# Patient Record
Sex: Male | Born: 1960 | Race: White | Hispanic: No | Marital: Married | Smoking: Never smoker
Health system: Southern US, Community
[De-identification: ages and names within clinical notes are randomized; demographics above are authoritative.]

## PROBLEM LIST (undated history)

## (undated) DIAGNOSIS — K831 Obstruction of bile duct: Secondary | ICD-10-CM

## (undated) DIAGNOSIS — D129 Benign neoplasm of anus and anal canal: Secondary | ICD-10-CM

## (undated) DIAGNOSIS — M109 Gout, unspecified: Secondary | ICD-10-CM

## (undated) DIAGNOSIS — I10 Essential (primary) hypertension: Secondary | ICD-10-CM

## (undated) HISTORY — DX: Gout, unspecified: M10.9

## (undated) HISTORY — DX: Benign neoplasm of anus and anal canal: D12.9

## (undated) HISTORY — PX: BACK SURGERY: SHX140

## (undated) HISTORY — DX: Essential (primary) hypertension: I10

## (undated) SURGERY — ERCP, WITH INTERVENTION IF INDICATED
Anesthesia: Moderate Sedation

---

## 1991-04-05 HISTORY — PX: CHOLECYSTECTOMY: SHX55

## 1999-07-10 ENCOUNTER — Emergency Department (HOSPITAL_COMMUNITY): Admission: EM | Admit: 1999-07-10 | Discharge: 1999-07-10 | Payer: Self-pay | Admitting: Emergency Medicine

## 1999-07-10 ENCOUNTER — Encounter: Payer: Self-pay | Admitting: Emergency Medicine

## 2007-01-29 ENCOUNTER — Ambulatory Visit (HOSPITAL_COMMUNITY): Admission: RE | Admit: 2007-01-29 | Discharge: 2007-01-29 | Payer: Self-pay | Admitting: Neurological Surgery

## 2009-03-18 ENCOUNTER — Encounter: Payer: Self-pay | Admitting: Cardiovascular Disease

## 2009-11-11 ENCOUNTER — Ambulatory Visit: Payer: Self-pay | Admitting: Radiology

## 2009-11-11 ENCOUNTER — Encounter: Payer: Self-pay | Admitting: Cardiovascular Disease

## 2009-11-11 ENCOUNTER — Emergency Department (HOSPITAL_BASED_OUTPATIENT_CLINIC_OR_DEPARTMENT_OTHER): Admission: EM | Admit: 2009-11-11 | Discharge: 2009-11-11 | Payer: Self-pay | Admitting: Emergency Medicine

## 2009-11-19 ENCOUNTER — Ambulatory Visit: Payer: Self-pay | Admitting: Cardiovascular Disease

## 2009-11-19 DIAGNOSIS — R079 Chest pain, unspecified: Secondary | ICD-10-CM

## 2009-12-16 LAB — HM COLONOSCOPY

## 2010-01-01 ENCOUNTER — Telehealth: Payer: Self-pay | Admitting: Cardiovascular Disease

## 2010-01-08 ENCOUNTER — Ambulatory Visit: Payer: Self-pay | Admitting: Cardiovascular Disease

## 2010-05-04 NOTE — Assessment & Plan Note (Signed)
Summary: nph/chest pains   Visit Type:  Initial Consult Primary Provider:  Casimiro Needle Hilt,M.D.  CC:  c/o having chest pain and shoulder pain and increased BP.Marland Kitchen  History of Present Illness: 50 year old male with a history of back surgery 2 years ago, who reports having symptoms of malaise over the past week, hypertension, right-sided chest pain. He presented for evaluation.  He reports that one week ago, he was driving in his car and he felt hot and nauseous. He pulled over and his symptoms resolved. The next day, he did not feel right. He had decreased concentration, GI upset. He went to urgent care and they found his blood pressure to be 170/105 at decreased moderately on recheck. He was given an aspirin. He reported that his shoulders were sore, he had right-sided chest pressure. They sent him to the emergency room.  In the emergency room, EKG, cardiac enzymes were essentially normal. Chest x-ray was normal. His CK was mildly elevated at 464. Other cardiac markers were normal.he was discharged home. Since then he has felt somewhat fatigued but today he feels better.  EKG today shows normal sinus rhythm with rate 71 beats per minute, no significant ST or T wave changes.  EKG from August 10 shows normal sinus rhythm with rate 64 beats per minute, no significant ST or T wave changes.  Preventive Screening-Counseling & Management  Alcohol-Tobacco     Smoking Status: quit  Caffeine-Diet-Exercise     Does Patient Exercise: no      Drug Use:  no.    Current Medications (verified): 1)  None  Allergies (verified): No Known Drug Allergies  Past History:  Family History: Last updated: 11/19/2009 Family History of Hypertension: Mother & Father  Social History: Last updated: 11/19/2009 Tobacco Use - Former. occas. cigars only. Alcohol Use - yes Regular Exercise - no Drug Use - no Full Time--Operations Manager  Married   Risk Factors: Exercise: no (11/19/2009)  Risk  Factors: Smoking Status: quit (11/19/2009)  Past Medical History: Hypertension  Past Surgical History: gallbladder removed  Family History: Family History of Hypertension: Mother & Father  Social History: Tobacco Use - Former. occas. cigars only. Alcohol Use - yes Regular Exercise - no Drug Use - no Full Time--Operations Manager  Married  Smoking Status:  quit Does Patient Exercise:  no Drug Use:  no  Review of Systems  The patient denies fever, weight loss, weight gain, vision loss, decreased hearing, hoarseness, chest pain, syncope, dyspnea on exertion, peripheral edema, prolonged cough, abdominal pain, incontinence, muscle weakness, depression, and enlarged lymph nodes.         right side chest pain, now resolved. malaise, lethargy  Vital Signs:  Patient profile:   50 year old male Height:      74 inches Weight:      249 pounds BMI:     32.09 Pulse rate:   72 / minute BP sitting:   146 / 97  (left arm)  Vitals Entered By: Bishop Dublin, CMA (November 19, 2009 2:37 PM)  Physical Exam  General:  Well developed, well nourished, in no acute distress. Head:  normocephalic and atraumatic Neck:  Neck supple, no JVD. No masses, thyromegaly or abnormal cervical nodes. Lungs:  Clear bilaterally to auscultation and percussion. Heart:  Non-displaced PMI, chest non-tender; regular rate and rhythm, S1, S2 without murmurs, rubs or gallops. Carotid upstroke normal, no bruit. Normal abdominal aortic size, no bruits. Pedals normal pulses. No edema, no varicosities. Abdomen:  Bowel sounds positive; abdomen soft  and non-tender without masses, organomegaly, or hernias noted.  Msk:  Back normal, normal gait. Muscle strength and tone normal. Pulses:  pulses normal in all 4 extremities Extremities:  No clubbing or cyanosis. Neurologic:  Alert and oriented x 3. Skin:  Intact without lesions or rashes. Psych:  Normal affect.   Impression & Recommendations:  Problem # 1:  CHEST PAIN  UNSPECIFIED (ICD-786.50) etiology of his right-sided chest pain one week ago is likely noncardiac. He has numerous other symptoms concerning for a viral syndrome. Today he feels that it is resolving. I do not think that he needs additional workup though if he does develop additional episodes of chest pain, I suggested he could have a treadmill done at [facility] and that he could call me to set this up at any time.  He has not had any other additional risk factors concerning for premature coronary artery disease. He does not have a family history, he is a nonsmoker, not a diabetic. EKGs are normal.  Orders: EKG w/ Interpretation (93000)  Appended Document: nph/chest pains he has mentioned that his blood pressure has been borderline elevated. I have asked that he watch his blood pressure at home. He'll likely have to purchase a blood pressure cuff. If his blood pressure continues to run with systolics greater than 140, diastolic greater than 90, asked him to contact me. His parents take lisinopril and this is one medication we could try.

## 2010-05-04 NOTE — Progress Notes (Signed)
Summary: STRESS TEST  Phone Note Call from Patient Call back at [phone]   Caller: SELF Call For: [facility] Summary of Call: WOULD LIKE TO TALK TO Mariah Milling ABOUT GETTING A STRESS TEST (AS WAS DISCUSSED AT A PREVIOUS VISIT)-WOULD LIKE TO KNOW WHAT IT ENTAILS. Initial call taken by: Harlon Flor,  January 01, 2010 10:25 AM  Follow-up for Phone Call        ETT scheduled for 10/6 @ 8am pt to arrive at 7:30am. Pt aware of appt.  Follow-up by: Benedict Needy, RN,  January 01, 2010 11:10 AM     Appended Document: STRESS TEST APPT rescheduled 10/7 [facility]

## 2010-05-04 NOTE — Letter (Signed)
Summary: PHI  PHI   Imported By: Harlon Flor 11/23/2009 09:45:38  _____________________________________________________________________  External Attachment:    Type:   Image     Comment:   External Document

## 2010-06-18 LAB — CBC
HCT: 46.3 % (ref 39.0–52.0)
Hemoglobin: 16 g/dL (ref 13.0–17.0)
MCH: 31 pg (ref 26.0–34.0)
MCV: 90.1 fL (ref 78.0–100.0)
Platelets: 228 10*3/uL (ref 150–400)
RBC: 5.14 MIL/uL (ref 4.22–5.81)

## 2010-06-18 LAB — DIFFERENTIAL
Eosinophils Absolute: 0.1 10*3/uL (ref 0.0–0.7)
Eosinophils Relative: 1 % (ref 0–5)
Lymphocytes Relative: 21 % (ref 12–46)
Lymphs Abs: 1.7 10*3/uL (ref 0.7–4.0)
Monocytes Absolute: 0.4 10*3/uL (ref 0.1–1.0)
Monocytes Relative: 5 % (ref 3–12)

## 2010-06-18 LAB — BASIC METABOLIC PANEL
CO2: 27 mEq/L (ref 19–32)
Chloride: 107 mEq/L (ref 96–112)
Creatinine, Ser: 1 mg/dL (ref 0.4–1.5)
GFR calc Af Amer: >60 mL/min (ref >60)
Potassium: 4.1 mEq/L (ref 3.5–5.1)

## 2010-06-18 LAB — POCT CARDIAC MARKERS
CKMB, poc: 7.7 ng/mL (ref 1.0–8.0)
Myoglobin, poc: 136 ng/mL (ref 12–200)
Troponin i, poc: <0.05 ng/mL (ref 0.00–0.09)
Troponin i, poc: <0.05 ng/mL (ref 0.00–0.09)

## 2010-06-18 LAB — CK: Total CK: 464 U/L — ABNORMAL HIGH (ref 7–232)

## 2010-08-17 NOTE — Op Note (Signed)
NAME:  Jon Phillips, Jon Phillips          ACCOUNT NO.:  [id number]   MEDICAL RECORD NO.:  [id number]          PATIENT TYPE:  OIB   LOCATION:  3040                         FACILITY:  [facility]   PHYSICIAN:  Stefani Dama, M.D.  DATE OF BIRTH:  1961/02/12   DATE OF PROCEDURE:  01/29/2007  DATE OF DISCHARGE:                               OPERATIVE REPORT   PREOPERATIVE DIAGNOSIS:  Herniated nucleus pulposus, L5-S1, right with  right lumbar radiculopathy.   POSTOPERATIVE DIAGNOSIS:  Herniated nucleus pulposus, L5-S1, right with  right lumbar radiculopathy.   PROCEDURE:  Right L5-S1 laminotomy, microdiskectomy with operating  microscope, microdissection technique.   SURGEON:  Stefani Dama, M.D.   ANESTHESIA:  General endotracheal.   INDICATIONS:  Jon Phillips is a 50 year old individual who has had  significant back and right lower extremity pain.  He has evidence of a  right lumbar radiculopathy.  The patient was taken to the operating room  where he underwent lumbar microdiskectomy at this time.   PROCEDURE:  The patient was brought to the operating room supine on the  stretcher.  After a smooth induction of general endotracheal anesthesia,  he was turned prone.  The back was prepped with alcohol and DuraPrep and  then draped in a sterile fashion.  The hair was trimmed with clippers  prior to prepping.  A midline incision was created in the lumbar spine  and taken down to the lumbodorsal fascia after needle localization of  the L5-S1 space.  The paraspinous musculature was opened on the right  side over the L5-S1 space and a subperiosteal dissection was performed  to expose the interlaminar space at L5 and S1.  Then, the yellow  ligament was taken up with a 2 and 3-mm Kerrison punch.  The common  dural tube was exposed.  The L5 nerve root was noted to be tented  dorsally and laterally against the mass.  The medial wall of the facet  was taken down to facilitate exposure of the nerve and  also to help  mobilize the nerve carefully off the mass, which was found to be a large  free fragment of disk.  A singular large fragment of disk was removed  and this created an opening into the disk space itself.  Further  exploration yielded two other smaller fragments.  This allowed for good  decompression of the underside of the nerve root.  The disk fragments  themselves were somewhat adherent to both the dura and the underlying  ligament.  The ligamentous opening was then enlarged and the disk space  opening was enlarged, and this exposed a considerable amount of  considerably degenerated disk material.  This was resected in a  piecemeal fashion using a combination of curettes and rongeurs, nerve  hooks and probes.  This was also done with the use of the operating  microscope.  Microdissection technique was performed prior to the  diskectomy so as to allow mobilization of the nerve root more fully and  exposure in the laminotomy site.  In the end, the disk space was  decompressed of a substantial quantity of disk material.  The nerve root  itself was well decompressed in its travel out the foramen.  Hemostasis  was achieved in all quadrants.  The wound was irrigated copiously with  antibiotic irrigating solution, and then the microscope was removed and  the lumbodorsal fascia was then closed with #1 Vicryl in an interrupted  fashion, 2-0 Vicryl was used in the subcutaneous tissues, 3-0 Vicryl was  used to close the subcuticular tissues.  A total of 30 mL of 0.5%  Marcaine along with 5 mL of 1% local lidocaine with epinephrine was  injected into both subcutaneous and deep tissues.   The patient tolerated the procedure well.  Blood loss estimated about 50  mL.  He was returned to the recovery room in stable condition.      Stefani Dama, M.D.  Electronically Signed     HJE/MEDQ  D:  01/29/2007  T:  01/29/2007  Job:  [id number]

## 2010-10-13 ENCOUNTER — Encounter: Payer: Self-pay | Admitting: Cardiovascular Disease

## 2011-01-12 LAB — BASIC METABOLIC PANEL
BUN: 7
Creatinine, Ser: 0.98
GFR calc non Af Amer: >60
Glucose, Bld: 94

## 2011-01-12 LAB — CBC
HCT: 43.5
Platelets: 210
RDW: 12.1
WBC: 7.2

## 2011-04-05 DIAGNOSIS — K831 Obstruction of bile duct: Secondary | ICD-10-CM

## 2011-04-05 HISTORY — DX: Obstruction of bile duct: K83.1

## 2011-04-29 ENCOUNTER — Ambulatory Visit
Admission: RE | Admit: 2011-04-29 | Discharge: 2011-04-29 | Disposition: A | Discharge status: Home / Self Care | Payer: BC Managed Care – PPO | Source: Ambulatory Visit | Attending: Family Medicine | Admitting: Family Medicine

## 2011-04-29 ENCOUNTER — Other Ambulatory Visit: Payer: Self-pay | Admitting: Family Medicine

## 2011-04-29 DIAGNOSIS — R17 Unspecified jaundice: Secondary | ICD-10-CM

## 2011-05-02 ENCOUNTER — Other Ambulatory Visit: Payer: Self-pay | Admitting: Family Medicine

## 2011-05-02 DIAGNOSIS — K769 Liver disease, unspecified: Secondary | ICD-10-CM

## 2011-05-03 ENCOUNTER — Ambulatory Visit
Admission: RE | Admit: 2011-05-03 | Discharge: 2011-05-03 | Disposition: A | Discharge status: Home / Self Care | Payer: BC Managed Care – PPO | Source: Ambulatory Visit | Attending: Family Medicine | Admitting: Family Medicine

## 2011-05-03 DIAGNOSIS — K769 Liver disease, unspecified: Secondary | ICD-10-CM

## 2011-05-03 MED ORDER — GADOBENATE DIMEGLUMINE 529 MG/ML IV SOLN
20.0000 mL | Freq: Once | INTRAVENOUS | Status: AC | PRN
  Administered 2011-05-03: 20 mL via INTRAVENOUS

## 2011-05-04 ENCOUNTER — Ambulatory Visit (HOSPITAL_COMMUNITY)
Admission: RE | Admit: 2011-05-04 | Discharge: 2011-05-04 | Disposition: A | Discharge status: Home / Self Care | Payer: BC Managed Care – PPO | Source: Ambulatory Visit | Attending: Gastroenterology | Admitting: Gastroenterology

## 2011-05-04 ENCOUNTER — Encounter (HOSPITAL_COMMUNITY): Payer: Self-pay | Admitting: *Deleted

## 2011-05-04 ENCOUNTER — Ambulatory Visit (HOSPITAL_COMMUNITY): Payer: BC Managed Care – PPO

## 2011-05-04 ENCOUNTER — Encounter (HOSPITAL_COMMUNITY)
Admission: RE | Disposition: A | Discharge status: Home / Self Care | Payer: Self-pay | Source: Ambulatory Visit | Attending: Gastroenterology

## 2011-05-04 ENCOUNTER — Other Ambulatory Visit: Payer: Self-pay | Admitting: Gastroenterology

## 2011-05-04 DIAGNOSIS — K838 Other specified diseases of biliary tract: Secondary | ICD-10-CM | POA: Insufficient documentation

## 2011-05-04 DIAGNOSIS — I10 Essential (primary) hypertension: Secondary | ICD-10-CM | POA: Insufficient documentation

## 2011-05-04 DIAGNOSIS — K831 Obstruction of bile duct: Secondary | ICD-10-CM | POA: Insufficient documentation

## 2011-05-04 DIAGNOSIS — R748 Abnormal levels of other serum enzymes: Secondary | ICD-10-CM | POA: Insufficient documentation

## 2011-05-04 DIAGNOSIS — R17 Unspecified jaundice: Secondary | ICD-10-CM | POA: Insufficient documentation

## 2011-05-04 HISTORY — PX: ERCP: SHX5425

## 2011-05-04 LAB — CBC
HCT: 43 % (ref 39.0–52.0)
MCHC: 35.3 g/dL (ref 30.0–36.0)
MCV: 87.8 fL (ref 78.0–100.0)
Platelets: 238 10*3/uL (ref 150–400)
RDW: 13.3 % (ref 11.5–15.5)
WBC: 7.7 10*3/uL (ref 4.0–10.5)

## 2011-05-04 LAB — COMPREHENSIVE METABOLIC PANEL
CO2: 23 mEq/L (ref 19–32)
Calcium: 9.7 mg/dL (ref 8.4–10.5)
Creatinine, Ser: 0.75 mg/dL (ref 0.50–1.35)
GFR calc Af Amer: >90 mL/min (ref >90)
GFR calc non Af Amer: >90 mL/min (ref >90)
Glucose, Bld: 93 mg/dL (ref 70–99)
Sodium: 135 mEq/L (ref 135–145)
Total Protein: 8 g/dL (ref 6.0–8.3)

## 2011-05-04 SURGERY — ERCP, WITH INTERVENTION IF INDICATED
Anesthesia: Moderate Sedation

## 2011-05-04 MED ORDER — BUTAMBEN-TETRACAINE-BENZOCAINE 2-2-14 % EX AERO
INHALATION_SPRAY | CUTANEOUS | Status: DC | PRN
  Administered 2011-05-04: 2 via TOPICAL

## 2011-05-04 MED ORDER — FENTANYL CITRATE 0.05 MG/ML IJ SOLN
INTRAMUSCULAR | Status: DC | PRN
  Administered 2011-05-04 (×4): 25 ug via INTRAVENOUS

## 2011-05-04 MED ORDER — SODIUM CHLORIDE 0.9 % IV SOLN
INTRAVENOUS | Status: DC | PRN
  Administered 2011-05-04: 10:00:00

## 2011-05-04 MED ORDER — MIDAZOLAM HCL 10 MG/2ML IJ SOLN
INTRAMUSCULAR | Status: DC | PRN
  Administered 2011-05-04 (×4): 2 mg via INTRAVENOUS

## 2011-05-04 MED ORDER — SODIUM CHLORIDE 0.9 % IV SOLN
Freq: Once | INTRAVENOUS | Status: AC
  Administered 2011-05-04: 500 mL via INTRAVENOUS

## 2011-05-04 MED ORDER — MIDAZOLAM HCL 10 MG/2ML IJ SOLN
INTRAMUSCULAR | Status: AC
  Filled 2011-05-04: qty 4

## 2011-05-04 MED ORDER — DIPHENHYDRAMINE HCL 50 MG/ML IJ SOLN
INTRAMUSCULAR | Status: AC
  Filled 2011-05-04: qty 1

## 2011-05-04 MED ORDER — FENTANYL CITRATE 0.05 MG/ML IJ SOLN
INTRAMUSCULAR | Status: AC
  Filled 2011-05-04: qty 4

## 2011-05-04 MED ORDER — GLUCAGON HCL (RDNA) 1 MG IJ SOLR
INTRAMUSCULAR | Status: AC
  Filled 2011-05-04: qty 1

## 2011-05-04 MED ORDER — DEXTROSE 5 % IV SOLN
1.0000 g | INTRAVENOUS | Status: AC
  Administered 2011-05-04: 1 g via INTRAVENOUS
  Filled 2011-05-04: qty 1

## 2011-05-04 NOTE — Op Note (Signed)
[facility] [address] [place], [place]  [address]  ERCP PROCEDURE REPORT  PATIENT:  Jon Phillips, Jon Phillips  MR#:  [id number] BIRTHDATE:  1960-12-30  GENDER:  male  ENDOSCOPIST:  Vida Rigger, MD ASSISTANT:  Rolanda Lundborg, Loleta Rose. Domingo Mend, RN  PROCEDURE DATE:  05/04/2011 PROCEDURE:  ERCP with balloon passage, ERCP with sphincterotomy, ERCP with stent placement, ERCP with brushing ASA CLASS:  Class I  INDICATIONS:  elevated liver tests abnormal MRCP  MEDICATIONS:  100 mcg fentanyl 8 mg Versed TOPICAL ANESTHETIC: Used  DESCRIPTION OF PROCEDURE:   After the risks benefits and alternatives of the procedure were thoroughly explained, informed consent was obtained.  The [id number] ([id number]) endoscope was introduced through the mouth and advanced to the second portion of the duodenum.  The examination was normal with no endoscopic findings except a small bulge just above the ampulla. Using the triple-lumen sphincterotome loaded with the JAG Jagwire deep selective cannulation was obtained on the third or fourth attempt however the wire was advanced once towards the pancreas but no dye was injected into the pancreas. The CBD was filled with contrast but no stone was seen and there seemed to be a distal smooth stricture about 4 or 5 cm long so we initially went ahead and proceeded with a sphincterotomy in the customary fashion which we extended through the bullge which may have been a small cyst. The sphincterotomy was done until we had adequate biliary drainage and could  Get the fully bowed sphincterotome easily in and out of the duct . We then exchanged for sphincterotome for the 12-15 mm adjustable balloon and proceeded with 3 balloon pull-through but no obvious stone was seen and there was only minimal resistance just above the ampulla. On occlusion cholangiogram initially the cystic duct was patent but on delayed films there was possibly a stone  or sludge or debris in the cystic duct. We went ahead and exchanged the balloon for a brush and proceeded with brushing in the customary fashion. We then placed the occlusion balloon then injected below the balloon and again no stone was seen in the CBD and the stricture was confirmed. We elected to place a 10 French 7 cm stent in the customary fashion which was in the proper position under fluoroscopy and endoscopic guidance we elected to stop the procedure at this juncture. The scope was then completely withdrawn from the patient and the procedure terminated. <<PROCEDUREIMAGES>>  COMPLICATIONS:  None ENDOSCOPIC IMPRESSION: 1 small bulge above the ampulla questionably small cyst 2. No stones seen in CBD questionably one in cystic duct on delayed films 3. 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 1 minimal pancreatic wire advancement but no pancreatic duct injections RECOMMENDATIONS: Observe for a delayed complications await brushing follow liver tests back to normal and probable proceed with an EUS next and will discuss with my partner Dr. Dulce Sellar and followup with me in the office in a few weeks unless needed sooner when necessary  ______________________________ Vida Rigger, MD  CC:  Lavada Mesi, MD  n. Rosalie DoctorVida Rigger at 05/04/2011 10:15 AM  Jodean Lima, [phone]

## 2011-05-05 ENCOUNTER — Encounter (HOSPITAL_COMMUNITY): Payer: Self-pay | Admitting: Gastroenterology

## 2011-05-05 LAB — TYPE AND SCREEN: ABO/RH(D): O POS

## 2011-06-27 ENCOUNTER — Other Ambulatory Visit (HOSPITAL_COMMUNITY): Payer: BC Managed Care – PPO

## 2011-06-28 ENCOUNTER — Ambulatory Visit (HOSPITAL_COMMUNITY)
Admission: RE | Admit: 2011-06-28 | Discharge: 2011-06-28 | Disposition: A | Discharge status: Home / Self Care | Payer: BC Managed Care – PPO | Source: Ambulatory Visit | Attending: Gastroenterology | Admitting: Gastroenterology

## 2011-06-28 ENCOUNTER — Ambulatory Visit (HOSPITAL_COMMUNITY): Payer: BC Managed Care – PPO

## 2011-06-28 ENCOUNTER — Encounter (HOSPITAL_COMMUNITY): Payer: Self-pay | Admitting: Anesthesiology

## 2011-06-28 ENCOUNTER — Ambulatory Visit (HOSPITAL_COMMUNITY): Payer: BC Managed Care – PPO | Admitting: Anesthesiology

## 2011-06-28 ENCOUNTER — Encounter (HOSPITAL_COMMUNITY)
Admission: RE | Disposition: A | Discharge status: Home / Self Care | Payer: Self-pay | Source: Ambulatory Visit | Attending: Gastroenterology

## 2011-06-28 ENCOUNTER — Encounter (HOSPITAL_COMMUNITY): Payer: Self-pay | Admitting: *Deleted

## 2011-06-28 DIAGNOSIS — Z9089 Acquired absence of other organs: Secondary | ICD-10-CM | POA: Insufficient documentation

## 2011-06-28 DIAGNOSIS — I10 Essential (primary) hypertension: Secondary | ICD-10-CM | POA: Insufficient documentation

## 2011-06-28 DIAGNOSIS — K8021 Calculus of gallbladder without cholecystitis with obstruction: Secondary | ICD-10-CM | POA: Insufficient documentation

## 2011-06-28 DIAGNOSIS — K831 Obstruction of bile duct: Secondary | ICD-10-CM | POA: Insufficient documentation

## 2011-06-28 HISTORY — PX: EUS: SHX5427

## 2011-06-28 HISTORY — PX: ERCP: SHX5425

## 2011-06-28 LAB — CBC
HCT: 40.1 % (ref 39.0–52.0)
Hemoglobin: 14.4 g/dL (ref 13.0–17.0)
MCH: 30.8 pg (ref 26.0–34.0)
MCV: 85.9 fL (ref 78.0–100.0)
RBC: 4.67 MIL/uL (ref 4.22–5.81)
WBC: 6.9 10*3/uL (ref 4.0–10.5)

## 2011-06-28 LAB — COMPREHENSIVE METABOLIC PANEL
CO2: 26 mEq/L (ref 19–32)
Calcium: 9 mg/dL (ref 8.4–10.5)
Chloride: 104 mEq/L (ref 96–112)
Creatinine, Ser: 0.96 mg/dL (ref 0.50–1.35)
GFR calc Af Amer: >90 mL/min (ref >90)
GFR calc non Af Amer: >90 mL/min (ref >90)
Glucose, Bld: 91 mg/dL (ref 70–99)
Total Bilirubin: 0.5 mg/dL (ref 0.3–1.2)

## 2011-06-28 LAB — TYPE AND SCREEN: Antibody Screen: NEGATIVE

## 2011-06-28 SURGERY — ERCP, WITH INTERVENTION IF INDICATED
Anesthesia: General

## 2011-06-28 MED ORDER — FENTANYL CITRATE 0.05 MG/ML IJ SOLN
INTRAMUSCULAR | Status: DC | PRN
  Administered 2011-06-28: 150 ug via INTRAVENOUS
  Administered 2011-06-28: 100 ug via INTRAVENOUS

## 2011-06-28 MED ORDER — ONDANSETRON HCL 4 MG/2ML IJ SOLN
INTRAMUSCULAR | Status: DC | PRN
  Administered 2011-06-28: 4 mg via INTRAVENOUS

## 2011-06-28 MED ORDER — FENTANYL CITRATE 0.05 MG/ML IJ SOLN: 25.0000 ug | INTRAMUSCULAR | Status: DC | PRN

## 2011-06-28 MED ORDER — LACTATED RINGERS IV SOLN
INTRAVENOUS | Status: DC
  Administered 2011-06-28: 09:00:00 via INTRAVENOUS
  Administered 2011-06-28: 1000 mL via INTRAVENOUS

## 2011-06-28 MED ORDER — ROCURONIUM BROMIDE 100 MG/10ML IV SOLN
INTRAVENOUS | Status: DC | PRN
  Administered 2011-06-28: 2 mg via INTRAVENOUS

## 2011-06-28 MED ORDER — SUCCINYLCHOLINE CHLORIDE 20 MG/ML IJ SOLN
INTRAMUSCULAR | Status: DC | PRN
  Administered 2011-06-28: 100 mg via INTRAVENOUS

## 2011-06-28 MED ORDER — PROPOFOL 10 MG/ML IV BOLUS
INTRAVENOUS | Status: DC | PRN
  Administered 2011-06-28: 200 mg via INTRAVENOUS

## 2011-06-28 MED ORDER — CEFOTETAN DISODIUM 1 G IJ SOLR
1.0000 g | Freq: Two times a day (BID) | INTRAMUSCULAR | Status: DC
Start: 2011-06-28 — End: 2011-06-28
  Administered 2011-06-28: 1 g via INTRAVENOUS
  Filled 2011-06-28 (×3): qty 1

## 2011-06-28 MED ORDER — SODIUM CHLORIDE 0.9 % IV SOLN
INTRAVENOUS | Status: DC | PRN
  Administered 2011-06-28: 10:00:00

## 2011-06-28 MED ORDER — LIDOCAINE HCL (CARDIAC) 20 MG/ML IV SOLN
INTRAVENOUS | Status: DC | PRN
  Administered 2011-06-28: 75 mg via INTRAVENOUS

## 2011-06-28 MED ORDER — GLUCAGON HCL (RDNA) 1 MG IJ SOLR
INTRAMUSCULAR | Status: DC | PRN
  Administered 2011-06-28: 1 mg via INTRAVENOUS

## 2011-06-28 MED ORDER — EPHEDRINE SULFATE 50 MG/ML IJ SOLN
INTRAMUSCULAR | Status: DC | PRN
  Administered 2011-06-28 (×3): 5 mg via INTRAVENOUS

## 2011-06-28 MED ORDER — MIDAZOLAM HCL 5 MG/5ML IJ SOLN
INTRAMUSCULAR | Status: DC | PRN
  Administered 2011-06-28: 2 mg via INTRAVENOUS

## 2011-06-28 NOTE — Anesthesia Postprocedure Evaluation (Signed)
  Anesthesia Post-op Note  Patient: Jon Phillips  Procedure(s) Performed: Procedure(s) (LRB): LOWER ENDOSCOPIC ULTRASOUND (EUS) (N/A) ENDOSCOPIC RETROGRADE CHOLANGIOPANCREATOGRAPHY (ERCP) (N/A)  Patient Location: PACU  Anesthesia Type: General  Level of Consciousness: awake and alert   Airway and Oxygen Therapy: Patient Spontanous Breathing  Post-op Pain: mild  Post-op Assessment: Post-op Vital signs reviewed, Patient's Cardiovascular Status Stable, Respiratory Function Stable, Patent Airway and No signs of Nausea or vomiting  Post-op Vital Signs: stable  Complications: No apparent anesthesia complications

## 2011-06-28 NOTE — Transfer of Care (Signed)
Immediate Anesthesia Transfer of Care Note  Patient: Jon Phillips  Procedure(s) Performed: Procedure(s) (LRB): LOWER ENDOSCOPIC ULTRASOUND (EUS) (N/A) ENDOSCOPIC RETROGRADE CHOLANGIOPANCREATOGRAPHY (ERCP) (N/A)  Patient Location: Endoscopy Unit  Anesthesia Type: General  Level of Consciousness: awake, sedated and patient cooperative  Airway & Oxygen Therapy: Patient Spontanous Breathing and Patient connected to face mask oxygen  Post-op Assessment: Report given to PACU RN and Post -op Vital signs reviewed and stable  Post vital signs: Reviewed and stable  Complications: No apparent anesthesia complications

## 2011-06-28 NOTE — Anesthesia Preprocedure Evaluation (Addendum)
Anesthesia Evaluation  Patient identified by MRN, date of birth, ID band Patient awake    Reviewed: Allergy & Precautions, H&P , NPO status , Patient's Chart, lab work & pertinent test results  Airway Mallampati: II TM Distance: >3 FB Neck ROM: Full    Dental No notable dental hx.    Pulmonary  No smoking history. breath sounds clear to auscultation  Pulmonary exam normal       Cardiovascular hypertension, Pt. on medications Rhythm:Regular Rate:Normal     Neuro/Psych negative neurological ROS  negative psych ROS   GI/Hepatic negative GI ROS, Neg liver ROS,   Endo/Other  negative endocrine ROS  Renal/GU negative Renal ROS  negative genitourinary   Musculoskeletal negative musculoskeletal ROS (+)   Abdominal   Peds negative pediatric ROS (+)  Hematology negative hematology ROS (+)   Anesthesia Other Findings   Reproductive/Obstetrics negative OB ROS                          Anesthesia Physical Anesthesia Plan  ASA: II  Anesthesia Plan: General   Post-op Pain Management:    Induction: Intravenous  Airway Management Planned: Oral ETT  Additional Equipment:   Intra-op Plan:   Post-operative Plan: Extubation in OR  Informed Consent: I have reviewed the patients History and Physical, chart, labs and discussed the procedure including the risks, benefits and alternatives for the proposed anesthesia with the patient or authorized representative who has indicated his/her understanding and acceptance.   Dental advisory given  Plan Discussed with: CRNA  Anesthesia Plan Comments:         Anesthesia Quick Evaluation

## 2011-06-28 NOTE — Op Note (Signed)
[facility] [address] [place], [place]  [address]  ERCP PROCEDURE REPORT  PATIENT:  Jon, Phillips  MR#:  [id number] BIRTHDATE:  Aug 09, 1960  GENDER:  male  ENDOSCOPIST:  Vida Rigger, MD ASSISTANT:  Bary Leriche  PROCEDURE DATE:  06/28/2011 PROCEDURE:  ERCP with brushing, ERCP with removal of stones, ERCP with sphincterotomy ASA CLASS:  Class II  INDICATIONS:    cystic duct stone  MEDICATIONS:   See Anesthesia Report. TOPICAL ANESTHETIC:  Cetacaine Spray  DESCRIPTION OF PROCEDURE:   After the risks benefits and alternatives of the procedure were thoroughly explained, informed consent was obtained.  The PENTAX [id number] endoscope was introduced through the mouth after my partner Dr. Dulce Sellar was done the EUS and advanced to the second portion of the duodenum. A normal appearing ampulla was brought into view and a small sphincterotomy site was seen and easily cannulated throughout the procedure and no PD injections or wire advancements were done.. We initially cannulated with the sphincterotome loaded with JAG Jagwire and deep selective cannulation was obtained and we elected first to enlarge the sphincterotomy site in the customary fashion until we had adequate biliary drainage and could get the fully bowed sphincterotome easily in and out of the duct. We then went ahead and brushed the distal part of the duct in the customary fashion and once done reinserted the sphincterotome and selectively cannulated the cystic duct and the wire was coiled at the end of the cystic duct. The stone was not only confirmed on EUS but seen on initial cholangiogram as well we then exchanged the adjustable balloon 12-15 mm and advanced it above the stone and proceeded with a balloon pull-through and stone removal in the customary fashion and with mild pressure the stone was delivered we then proceeded with a few occlusion cholangiogram which were normal without obvious  stricture or residual stones and the balloon passed readily through the patent sphincterotomy site although drainage was slightly sluggish. We elected to stop the procedure at this juncture and since no stricture was seen we elected to leave the stent out  The limited endoscopic examination was normal with no endoscopic findings.  The scope was then completely withdrawn from the patient and the procedure terminated. <<PROCEDUREIMAGES>>  COMPLICATIONS:  None ENDOSCOPIC IMPRESSION: 1 cystic duct stone status post removal with a balloon after increasing sphincterotomy and brushing distal CBD 2. No pancreatic duct injections or wire advancements 3. Normal occlusion cholangiogram at the end of the procedure  RECOMMENDATIONS: Observe for delayed complications await pathology and cytology call us when necessary and otherwise followup in one month to recheck symptoms liver tests and make sure no further workup and plans are needed  ______________________________ Vida Rigger, MD  CC:  Lavada Mesi, MD  n. Rosalie DoctorVida Rigger at 06/28/2011 09:43 AM  Jodean Lima, [phone]

## 2011-06-28 NOTE — Progress Notes (Signed)
Patient has had some abdominal pain and inability to pass flatus. Passing some gas now feels much better  Color pink B/P  Ok  Report to  Dr Council Mechanic patient may be discharged,

## 2011-06-28 NOTE — H&P (Signed)
Patient interval history reviewed.  Patient examined again.  There has been no change from documented H/P dated 06/24/11 and 05/20/11 (scanned into chart from our office) except as documented above.  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  Plan:  Endoscopic ultrasound to rule out pancreaticobiliary lesion, choledocholithiasis, cystic duct stone.           Endoscopic retrograde cholangiopancreatography (likely by Dr. Ewing Schlein) to follow.  Risks (bleeding, infection, bowel perforation that could require surgery, sedation-related changes in cardiopulmonary systems), benefits (identification and possible treatment of source of symptoms, exclusion of certain causes of symptoms), and alternatives (watchful waiting, radiographic imaging studies, empiric medical treatment) of upper endoscopy with ultrasound (EUS) were explained to patient in detail and he wishes to proceed.  Risks (up to and including bleeding, infection, perforation, pancreatitis that can be complicated by infected necrosis and death), benefits (removal of stones, alleviating blockage, decreasing risk of cholangitis or choledocholithiasis-related pancreatitis), and alternatives (watchful waiting, percutaneous transhepatic cholangiography) of ERCP were explained to patient in detail and he elects to proceed.

## 2011-06-28 NOTE — Discharge Instructions (Signed)
Endoscopic Retrograde Cholangiopancreatography (ERCP) °ERCP stands for endoscopic retrograde cholangiopancreatography. In this procedure, a thin, lighted tube (endoscope) is used. It is passed through the mouth and down the back of the throat into the upper part of the intestine, called the duodenum. A small, plastic tube (cannula) is then passed through the endoscope. It is directed into the bile duct or pancreatic duct. Dye is then injected through the tube. X-rays are taken to study the biliary and pancreatic passageways. This procedure is used to diagnosis many diseases of the pancreas, bile ducts, liver, and gallbladder. °LET YOUR CAREGIVER KNOW ABOUT:  °· Allergies to food or medicine.  °· Medicines taken, including vitamins, herbs, eyedrops, over-the-counter medicines, and creams.  °· Use of steroids (by mouth or creams).  °· Previous problems with anesthetics or numbing medicines.  °· History of bleeding problems or blood clots.  °· Previous surgery.  °· Other health problems, including diabetes and kidney problems.  °· Possibility of pregnancy, if this applies.  °· Any barium X-rays during the past week.  °BEFORE THE PROCEDURE  °· Do not eat or drink anything, including water, for at least 6 hours before the procedure.  °· Ask your caregiver whether you should stop taking certain medicines prior to your procedure.  °· Arrange for someone to drive you home. You will not be allowed to drive for several hours after the procedure.  °· Arrive at least 60 minutes before the procedure or as directed. This will give you time to check in and fill out any necessary paperwork.  °PROCEDURE  °· You will be given medicine through a vein (intravenously) to make you relaxed and sleepy.  °· You might have a breathing tube placed to give you medicine that makes you sleep (general anesthetic).  °· Your throat may be sprayed with medicine that numbs the area (local anesthetic).  °· You will lie on your left side. The endoscope  will be inserted through your mouth and into the duodenum. The tube will not interfere with your breathing. Gagging is prevented by the anesthesia.  °· While X-rays are being taken, you may be positioned on your stomach.  °During the ERCP, the person performing the procedure may identify a blockage or narrowed opening to the bile duct. A muscular portion of the main bile duct may be partially cut (sphincterotomy). A thin, plastic tube (stent) will be positioned inside your bile duct. This is to allow fluid secreted by the liver (bile) to flow more easily through the narrowed opening. °AFTER THE PROCEDURE  °· You will rest in bed until you are fully conscious.  °· When you first wake up, your throat may feel slightly sore.  °· You will not be allowed to eat or drink until numbness subsides.  °· Once you are able to drink, urinate, and sit on the edge of the bed without feeling sick to your stomach (nauseous) or dizzy, you may be allowed to go home.  °· Have a friend or family member with you for the first 24 hours after your procedure.  °SEEK MEDICAL CARE IF:  °· You have an oral temperature above 102° F (38.9° C).  °· You develop other signs of infection, including chills or feeling unwell.  °· You have abdominal pain.  °· You have questions or concerns.  °MAKE SURE YOU:  °· Understand these instructions.  °· Will watch your condition.  °· Will get help right away if you are not doing well or get worse.  °  Document Released: 12/14/2000 Document Revised: 12/01/2010 Document Reviewed: 07/07/2009 °Exit[employer]® Patient Information ©2012 [employer]. ° °

## 2011-06-28 NOTE — Op Note (Signed)
[facility] [address] [place], [place]  [address]  ENDOSCOPIC ULTRASOUND PROCEDURE REPORT  PATIENT:  Jon Phillips, Jon Phillips  MR#:  [id number] BIRTHDATE:  [birth year]  GENDER:  male  ENDOSCOPIST:  Willis Modena, MD REFERRED BY:  Lavada Mesi, M.D. Vida Rigger, MD PROCEDURE DATE:  06/28/2011 PROCEDURE:  Upper EUS ASA CLASS:  Class II INDICATIONS:  obstructive jaundice, CBD stricture, possible cystic duct stone MEDICATIONS:   Cetacaine spray x 2, general anesthesia  DESCRIPTION OF PROCEDURE:   After the risks benefits and alternatives of the procedure were  explained, informed consent was obtained. The patient was then placed in the left, lateral, decubitus postion and IV sedation was administered. Throughout the procedure, the patient's blood pressure, pulse and oxygen saturations were monitored continuously.  Under direct visualization, the  endoscope was introduced through the mouth and advanced to the second portion of the duodenum.  Water was used as necessary to provide an acoustic interface.  Upon completion of the imaging, water was removed and the patient was sent to the recovery room in satisfactory condition.  <<PROCEDUREIMAGES>>  FINDINGS:  Side-viewing endoscope was passed to the level of the ampulla; previously placed biliary stent was seen, grasped and subsequently removed with mini-snare (and sent in-toto for cytologic analysis).  Side-viewing duodenoscope was removed and then replaced with radial echoendoscope.  Pancreatic head and uncinate process were seen and appeared normal without evidence of chronic pancreatitis or mass.  The ampulla was seen; there were some soft hyperechoic areas within the ampulla and distal bile duct; this is likely debris from the stent, but a soft tissue lesion (e.g., cholangiocarcinoma) can't be definitively exclude. There was suggestion of persistent stricture in the distal-most 2-3 cm of the bile duct.  CBD not  dilated, the the cystic duct was dilated to   1cm.  There was a 7 x 13mm shadowing stone seen in the distal cystic duct stump.  Post-cholecystectomy anatomy seen.  ENDOSCOPIC IMPRESSION:    1.  Endoscopic removal of temporary biliary stent, sent for cytologic analysis. 2.  Prominent cystic duct with intraductal stone. 3.  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 4.  Hyperechoic tissue within distal bile duct, suspect debris from prior stent but can't definitively             rule out soft tissue lesion. 5.  No obvious pancreatic head or uncinate mass lesion or chronic pancreatitis.  RECOMMENDATIONS:      1.  Watch for potential complications of procedure. 2.  Await stent cytology. 3.  ERCP to follow by Dr. Ewing Schlein.  ______________________________ Willis Modena  CC:  n. eSIGNEDWillis Modena at 06/28/2011 09:14 AM  Jodean Lima, [phone]

## 2011-06-29 ENCOUNTER — Encounter (HOSPITAL_COMMUNITY): Payer: Self-pay | Admitting: Gastroenterology

## 2011-07-01 ENCOUNTER — Encounter (HOSPITAL_COMMUNITY): Payer: Self-pay | Admitting: Emergency Medicine

## 2011-07-01 ENCOUNTER — Inpatient Hospital Stay (HOSPITAL_COMMUNITY)
Admission: EM | Admit: 2011-07-01 | Discharge: 2011-07-05 | DRG: 207 | Disposition: A | Discharge status: Home / Self Care | Payer: BC Managed Care – PPO | Attending: Internal Medicine | Admitting: Internal Medicine

## 2011-07-01 DIAGNOSIS — B9689 Other specified bacterial agents as the cause of diseases classified elsewhere: Secondary | ICD-10-CM | POA: Diagnosis present

## 2011-07-01 DIAGNOSIS — R109 Unspecified abdominal pain: Secondary | ICD-10-CM | POA: Diagnosis present

## 2011-07-01 DIAGNOSIS — R7401 Elevation of levels of liver transaminase levels: Secondary | ICD-10-CM | POA: Diagnosis present

## 2011-07-01 DIAGNOSIS — I10 Essential (primary) hypertension: Secondary | ICD-10-CM | POA: Diagnosis present

## 2011-07-01 DIAGNOSIS — E860 Dehydration: Secondary | ICD-10-CM | POA: Diagnosis present

## 2011-07-01 DIAGNOSIS — R17 Unspecified jaundice: Secondary | ICD-10-CM | POA: Diagnosis present

## 2011-07-01 DIAGNOSIS — K8309 Other cholangitis: Principal | ICD-10-CM | POA: Diagnosis present

## 2011-07-01 DIAGNOSIS — R112 Nausea with vomiting, unspecified: Secondary | ICD-10-CM | POA: Diagnosis present

## 2011-07-01 DIAGNOSIS — D72829 Elevated white blood cell count, unspecified: Secondary | ICD-10-CM | POA: Diagnosis present

## 2011-07-01 DIAGNOSIS — E876 Hypokalemia: Secondary | ICD-10-CM | POA: Diagnosis present

## 2011-07-01 DIAGNOSIS — R509 Fever, unspecified: Secondary | ICD-10-CM | POA: Diagnosis present

## 2011-07-01 DIAGNOSIS — R7402 Elevation of levels of lactic acid dehydrogenase (LDH): Secondary | ICD-10-CM | POA: Diagnosis present

## 2011-07-01 NOTE — ED Notes (Signed)
Pt presented to the ER with c/o abd pain and discomfort, pt showing the epigastric region, pt further explains that he had on 26th endoscopic procedure, until Thursday pt reporting feeling "fine", however, there after pt states "i feel horrible". Had x2 vomiting episode at home.

## 2011-07-02 ENCOUNTER — Emergency Department (HOSPITAL_COMMUNITY): Payer: BC Managed Care – PPO

## 2011-07-02 ENCOUNTER — Encounter (HOSPITAL_COMMUNITY): Payer: Self-pay | Admitting: Family Medicine

## 2011-07-02 DIAGNOSIS — K8309 Other cholangitis: Secondary | ICD-10-CM | POA: Diagnosis present

## 2011-07-02 DIAGNOSIS — I10 Essential (primary) hypertension: Secondary | ICD-10-CM

## 2011-07-02 LAB — COMPREHENSIVE METABOLIC PANEL
Albumin: 4.3 g/dL (ref 3.5–5.2)
BUN: 11 mg/dL (ref 6–23)
Calcium: 9.7 mg/dL (ref 8.4–10.5)
GFR calc Af Amer: >90 mL/min (ref >90)
Glucose, Bld: 94 mg/dL (ref 70–99)
Potassium: 3.8 mEq/L (ref 3.5–5.1)
Total Protein: 7.7 g/dL (ref 6.0–8.3)

## 2011-07-02 LAB — DIFFERENTIAL
Basophils Relative: 0 % (ref 0–1)
Eosinophils Absolute: 0 10*3/uL (ref 0.0–0.7)
Eosinophils Relative: 0 % (ref 0–5)
Lymphs Abs: 0.5 10*3/uL — ABNORMAL LOW (ref 0.7–4.0)
Monocytes Relative: 6 % (ref 3–12)
Neutrophils Relative %: 90 % — ABNORMAL HIGH (ref 43–77)

## 2011-07-02 LAB — CBC
Hemoglobin: 15.1 g/dL (ref 13.0–17.0)
MCH: 30.4 pg (ref 26.0–34.0)
MCHC: 35.7 g/dL (ref 30.0–36.0)
MCV: 85.3 fL (ref 78.0–100.0)
Platelets: 199 10*3/uL (ref 150–400)
RBC: 4.96 MIL/uL (ref 4.22–5.81)

## 2011-07-02 LAB — URINALYSIS, ROUTINE W REFLEX MICROSCOPIC
Leukocytes, UA: NEGATIVE
Nitrite: NEGATIVE
Specific Gravity, Urine: >1.046 — ABNORMAL HIGH (ref 1.005–1.030)
pH: 7 (ref 5.0–8.0)

## 2011-07-02 LAB — LIPASE, BLOOD: Lipase: 20 U/L (ref 11–59)

## 2011-07-02 MED ORDER — POTASSIUM CHLORIDE IN NACL 20-0.9 MEQ/L-% IV SOLN
INTRAVENOUS | Status: DC
  Administered 2011-07-02: 13:00:00 via INTRAVENOUS
  Filled 2011-07-02 (×7): qty 1000

## 2011-07-02 MED ORDER — MORPHINE SULFATE 2 MG/ML IJ SOLN
2.0000 mg | INTRAMUSCULAR | Status: DC | PRN
  Administered 2011-07-02: 2 mg via INTRAVENOUS
  Filled 2011-07-02: qty 1

## 2011-07-02 MED ORDER — SODIUM CHLORIDE 0.9 % IV BOLUS (SEPSIS)
1000.0000 mL | Freq: Once | INTRAVENOUS | Status: AC
  Administered 2011-07-02: 1000 mL via INTRAVENOUS

## 2011-07-02 MED ORDER — PANTOPRAZOLE SODIUM 40 MG IV SOLR
40.0000 mg | INTRAVENOUS | Status: DC
  Administered 2011-07-02 – 2011-07-05 (×4): 40 mg via INTRAVENOUS
  Filled 2011-07-02 (×5): qty 40

## 2011-07-02 MED ORDER — ONDANSETRON HCL 4 MG/2ML IJ SOLN
4.0000 mg | Freq: Four times a day (QID) | INTRAMUSCULAR | Status: DC | PRN
  Administered 2011-07-02: 4 mg via INTRAVENOUS
  Filled 2011-07-02: qty 2

## 2011-07-02 MED ORDER — LISINOPRIL 10 MG PO TABS
10.0000 mg | ORAL_TABLET | Freq: Every day | ORAL | Status: DC
  Filled 2011-07-02: qty 1

## 2011-07-02 MED ORDER — OXYCODONE HCL 5 MG PO TABS: 5.0000 mg | ORAL_TABLET | ORAL | Status: DC | PRN

## 2011-07-02 MED ORDER — MORPHINE SULFATE 2 MG/ML IJ SOLN
2.0000 mg | INTRAMUSCULAR | Status: DC | PRN
  Administered 2011-07-02 – 2011-07-04 (×3): 2 mg via INTRAVENOUS
  Filled 2011-07-02 (×3): qty 1

## 2011-07-02 MED ORDER — ENOXAPARIN SODIUM 60 MG/0.6ML ~~LOC~~ SOLN
50.0000 mg | SUBCUTANEOUS | Status: DC
  Administered 2011-07-02 – 2011-07-05 (×4): 50 mg via SUBCUTANEOUS
  Filled 2011-07-02 (×4): qty 0.6

## 2011-07-02 MED ORDER — METRONIDAZOLE IN NACL 5-0.79 MG/ML-% IV SOLN
500.0000 mg | Freq: Three times a day (TID) | INTRAVENOUS | Status: DC
  Administered 2011-07-02 – 2011-07-05 (×10): 500 mg via INTRAVENOUS
  Filled 2011-07-02 (×13): qty 100

## 2011-07-02 MED ORDER — IOHEXOL 300 MG/ML  SOLN
100.0000 mL | Freq: Once | INTRAMUSCULAR | Status: AC | PRN
  Administered 2011-07-02: 100 mL via INTRAVENOUS

## 2011-07-02 MED ORDER — ACETAMINOPHEN 500 MG PO TABS: 500.0000 mg | ORAL_TABLET | ORAL | Status: DC | PRN

## 2011-07-02 MED ORDER — ENOXAPARIN SODIUM 40 MG/0.4ML ~~LOC~~ SOLN
40.0000 mg | SUBCUTANEOUS | Status: DC
  Filled 2011-07-02: qty 0.4

## 2011-07-02 MED ORDER — LEVOFLOXACIN IN D5W 750 MG/150ML IV SOLN
750.0000 mg | Freq: Once | INTRAVENOUS | Status: AC
  Administered 2011-07-02: 750 mg via INTRAVENOUS
  Filled 2011-07-02: qty 150

## 2011-07-02 MED ORDER — ONDANSETRON HCL 4 MG/2ML IJ SOLN
4.0000 mg | Freq: Once | INTRAMUSCULAR | Status: AC
  Administered 2011-07-02: 4 mg via INTRAVENOUS
  Filled 2011-07-02: qty 4

## 2011-07-02 MED ORDER — ONDANSETRON HCL 4 MG PO TABS: 4.0000 mg | ORAL_TABLET | Freq: Four times a day (QID) | ORAL | Status: DC | PRN

## 2011-07-02 MED ORDER — LEVOFLOXACIN IN D5W 750 MG/150ML IV SOLN
750.0000 mg | INTRAVENOUS | Status: DC
  Administered 2011-07-03 – 2011-07-05 (×3): 750 mg via INTRAVENOUS
  Filled 2011-07-02 (×4): qty 150

## 2011-07-02 MED ORDER — HYDROMORPHONE HCL PF 1 MG/ML IJ SOLN
1.0000 mg | Freq: Once | INTRAMUSCULAR | Status: AC
  Administered 2011-07-02: 1 mg via INTRAVENOUS
  Filled 2011-07-02: qty 1

## 2011-07-02 NOTE — ED Provider Notes (Signed)
51 year old male who recently had removal of a cystic duct stone that came in with fever, chills, and upper abdominal pain. CT does show a dilated common bile duct. WBC is elevated and his liver functions have all worsened compared with the last her functions. I am concerned at this point that he has cholangitis and he will be admitted for IV hydration. He may need to be considered for repeat ERCP to see if there was another stone that had not been visible on prior evaluations.  Dione Booze, MD 07/02/11 [phone]

## 2011-07-02 NOTE — Progress Notes (Signed)
Subjective: Currently w/o nausea, vomiting or significant belly discomfort. Patient has received antiemetics and pain meds before my visit. Patient with low grade fever.  Objective: Vital signs in last 24 hours: Temp:  [99 F (37.2 C)-101.5 F (38.6 C)] 99 F (37.2 C) (03/30 0650) Pulse Rate:  [79-104] 79  (03/30 0650) Resp:  [20-24] 20  (03/30 0650) BP: (92-146)/(38-67) 100/61 mmHg (03/30 0650) SpO2:  [94 %-98 %] 96 % (03/30 0650) Weight:  [108.9 kg (240 lb 1.3 oz)] 108.9 kg (240 lb 1.3 oz) (03/30 4540) Weight change:  Last BM Date: 07/01/11  Intake/Output from previous day:       Physical Exam: General: Alert, awake, oriented x3, in mild distress. HEENT: No bruits, no goiter. Heart: Regular rate and rhythm, without murmurs, rubs, gallops. Lungs: Clear to auscultation bilaterally. Abdomen: Soft, RUQ and epigastric tenderness (control now with pain meds), nondistended, positive bowel sounds. Extremities: No clubbing cyanosis or edema with positive pedal pulses. Neuro: Grossly intact, nonfocal.  Lab Results: Basic Metabolic Panel:  [facility] 07/02/11 0058  NA 139  K 3.8  CL 101  CO2 26  GLUCOSE 94  BUN 11  CREATININE 0.95  CALCIUM 9.7  MG --  PHOS --   Liver Function Tests:  [facility] 07/02/11 0058  AST 466*  ALT 324*  ALKPHOS 140*  BILITOT 4.0*  PROT 7.7  ALBUMIN 4.3    Basename 07/02/11 0058  LIPASE 20  AMYLASE --   CBC:  Basename 07/02/11 0058  WBC 14.3*  NEUTROABS 13.0*  HGB 15.1  HCT 42.3  MCV 85.3  PLT 199   Urinalysis:  Basename 07/02/11 0608  COLORURINE AMBER*  LABSPEC >1.046*  PHURINE 7.0  GLUCOSEU NEGATIVE  HGBUR NEGATIVE  BILIRUBINUR SMALL*  KETONESUR 15*  PROTEINUR NEGATIVE  UROBILINOGEN 1.0  NITRITE NEGATIVE  LEUKOCYTESUR NEGATIVE    Studies/Results: Ct Abdomen Pelvis W Contrast  07/02/2011  *RADIOLOGY REPORT*  Clinical Data: Severe epigastric abdominal pain, nausea and vomiting.  Endoscopy with stent removal on  06/28/2011.  CT ABDOMEN AND PELVIS WITH CONTRAST  Technique:  Multidetector CT imaging of the abdomen and pelvis was performed following the standard protocol during bolus administration of intravenous contrast.  Contrast: OMNIPAQUE IOHEXOL 300 MG/ML IJ SOLN  Comparison: Abdominal series 07/02/2011  Findings: Atelectasis or focal infiltration in the lung bases.  Surgical absence of the gallbladder.  Mild bile duct dilatation with intrahepatic and extrahepatic ductal dilatation.  Maximal diameter of the common hepatic duct is about 12 mm.  This is nonspecific and could represent the early obstruction or physiologic change in the postcholecystectomy state.  Surgical clip versus focal calcification in the medial segment of the left lobe of the liver.  Surgical clips versus suture material in the perihepatic fat.  Small accessory spleen.  The pancreas, spleen, adrenal glands, kidneys, abdominal aorta, and retroperitoneal lymph nodes are unremarkable.  The stomach, small bowel, and colon are not distended and no wall thickening is visualized.  No free air or free fluid in the abdomen.  Pelvis:  The bladder wall is not thickened.  Mild enlargement of the prostate gland and seminal vesicles.  There appear to be surgical clips in the pelvis.  No free or loculated pelvic fluid collections.  No inflammatory changes associated with the sigmoid colon.  No significant pelvic lymphadenopathy.  The appendix is not specifically identified but no inflammatory changes are shown in the right lower quadrant.  Degenerative changes at the lumbosacral interspace.  No destructive bone lesions appreciated.  IMPRESSION: Nonspecific bile duct dilatation which may be postoperative although early obstruction is not excluded.  No evidence of pancreatitis.  Scattered surgical clips or suture material demonstrated in the medial segment left lobe the liver and in the fat adjacent to the lateral aspect of the liver.  No free air.  No free  fluid.  Original Report Authenticated By: Marlon Pel, M.D.   Dg Abd Acute W/chest  07/02/2011  *RADIOLOGY REPORT*  Clinical Data: Recent ERCP.  Abdominal pain, nausea.  Question free air.  ACUTE ABDOMEN SERIES (ABDOMEN 2 VIEW & CHEST 1 VIEW)  Comparison: Chest x-ray 11/11/2009  Findings: Bibasilar opacities, left slightly greater than right, likely atelectasis.  Recommend clinical correlation to exclude infection in the left lung base.  Heart is borderline in size.  No visible effusions.  No free air.  Gas within nondistended large and small bowel.  Prior cholecystectomy.  Moderate stool burden in the colon.  No suspicious calcification or acute bony abnormality.  IMPRESSION: Bibasilar opacities, left greater than right, likely atelectasis. Recommend clinical correlation to exclude left basilar pneumonia.  Borderline heart size.  No evidence of bowel obstruction or free air.  Original Report Authenticated By: Cyndie Chime, M.D.    Medications: Scheduled Meds:   . enoxaparin (LOVENOX) injection  50 mg Subcutaneous Q24H  .  HYDROmorphone (DILAUDID) injection  1 mg Intravenous Once  . levofloxacin (LEVAQUIN) IV  750 mg Intravenous Once  . levofloxacin (LEVAQUIN) IV  750 mg Intravenous Q24H  . metronidazole  500 mg Intravenous Q8H  . ondansetron (ZOFRAN) IV  4 mg Intravenous Once  . pantoprazole (PROTONIX) IV  40 mg Intravenous Q24H  . sodium chloride  1,000 mL Intravenous Once  . DISCONTD: enoxaparin  40 mg Subcutaneous Q24H  . DISCONTD: lisinopril  10 mg Oral Daily   Continuous Infusions:   . 0.9 % NaCl with KCl 20 mEq / L     PRN Meds:.acetaminophen, iohexol, morphine, ondansetron (ZOFRAN) IV, ondansetron, oxyCODONE, DISCONTD: morphine  Assessment/Plan: 1-Acute cholangitis: most likely associated with recent ERCP and some debris material or stone passing through. Will provide supportive care, IV antibiotics using levaquin and flagyl and will follow GI recommendations. Per Dr.  Randa Evens, patient might required replacement of biliary stent. Patient NPO  2-Hypertension: stable and well controlled; since he is NPO; will hold off on lisinopril for now.  3-Nausea, transaminitis and hyperbilirubinemia: associated with #1. Will use PRN antiemetics; continue supportive care and follow patient symptoms evolution.     LOS: 1 day   Subrena Devereux [facility] [phone]  07/02/2011, 11:48 AM

## 2011-07-02 NOTE — ED Provider Notes (Signed)
Medical screening examination/treatment/procedure(s) were conducted as a shared visit with non-physician practitioner(s) and myself.  I personally evaluated the patient during the encounter   Dione Booze, MD 07/02/11 [phone]

## 2011-07-02 NOTE — ED Notes (Signed)
Patient transported to CT 

## 2011-07-02 NOTE — Progress Notes (Signed)
ANTIBIOTIC CONSULT NOTE - INITIAL  Pharmacy Consult for Levaquin Indication: R/O Pneumonia  No Known Allergies  Patient Measurements: Height: 6' 3.2" (191 cm) Weight: 240 lb 1.3 oz (108.9 kg) IBW/kg (Calculated) : 84.95    Vital Signs: Temp: 100.4 F (38 C) (03/30 0230) Temp src: Oral (03/30 0230) BP: 117/53 mmHg (03/30 0330) Pulse Rate: 89  (03/30 0330) Intake/Output from previous day:   Intake/Output from this shift:    Labs:  Basename 07/02/11 0058  WBC 14.3*  HGB 15.1  PLT 199  LABCREA --  CREATININE 0.95   Estimated Creatinine Clearance: 121.7 ml/min (by C-G formula based on Cr of 0.95). No results found for this basename: VANCOTROUGH:2,VANCOPEAK:2,VANCORANDOM:2,GENTTROUGH:2,GENTPEAK:2,GENTRANDOM:2,TOBRATROUGH:2,TOBRAPEAK:2,TOBRARND:2,AMIKACINPEAK:2,AMIKACINTROU:2,AMIKACIN:2, in the last 72 hours   Microbiology: No results found for this or any previous visit (from the past 720 hour(s)).  Medical History: Past Medical History  Diagnosis Date  . Hypertension     Medications:  Scheduled:    . enoxaparin  40 mg Subcutaneous Q24H  .  HYDROmorphone (DILAUDID) injection  1 mg Intravenous Once  . levofloxacin (LEVAQUIN) IV  750 mg Intravenous Once  . lisinopril  10 mg Oral Daily  . ondansetron (ZOFRAN) IV  4 mg Intravenous Once  . pantoprazole (PROTONIX) IV  40 mg Intravenous Q24H  . sodium chloride  1,000 mL Intravenous Once   Infusions:    . 0.9 % NaCl with KCl 20 mEq / L     Assessment:  51 year old male s/p emergent ERCP 06/28/11.  Presents with complaint of abdominal pain, nausea/vomiting  Chext xray reveals opacities likely atelectasis; reccomends r/o left basilar pneumonia  Goal of Therapy:  Eradication of infection  Plan:  Levaquin 750mg  IV q24h  Russell Engelstad Trefz 07/02/2011,6:08 AM

## 2011-07-02 NOTE — ED Provider Notes (Signed)
History     CSN: [id number]  Arrival date & time 07/01/11  2226   First MD Initiated Contact with Patient 07/02/11 0033      Chief Complaint  Patient presents with  . Post-op Problem     HPI  History provided by the patient and family. Patient is a 51 year old male with history of cholecystectomy and ERCP performed 4 days ago who presents with complaints of severe upper abdominal pain with nausea and vomiting. Symptoms began on Thursday and have gradually increased. Patient reports only 2 episodes of vomiting at home. he has also felt hot with fever. Pain is described as sharp and severe. Patient denies any aggravating or alleviating factors. He denies any other associated symptoms. Denies any constipation, diarrhea, chest pain, cough, shortness of breath.    Past Medical History  Diagnosis Date  . Hypertension     Past Surgical History  Procedure Date  . Cholecystectomy   . Back surgery   . Ercp 05/04/2011    Procedure: ENDOSCOPIC RETROGRADE CHOLANGIOPANCREATOGRAPHY (ERCP);  Surgeon: Petra Kuba, MD;  Location: [facility] ENDOSCOPY;  Service: Endoscopy;  Laterality: N/A;  fluoro  . Eus 06/28/2011    Procedure: LOWER ENDOSCOPIC ULTRASOUND (EUS);  Surgeon: Petra Kuba, MD;  Location: [facility] ENDOSCOPY;  Service: Endoscopy;  Laterality: N/A;  Nicole/Katy  Dr. Dulce Sellar to do EUS  . Ercp 06/28/2011    Procedure: ENDOSCOPIC RETROGRADE CHOLANGIOPANCREATOGRAPHY (ERCP);  Surgeon: Petra Kuba, MD;  Location: [facility] ENDOSCOPY;  Service: Endoscopy;  Laterality: N/A;  Dr. Ewing Schlein to do ERCP    Family History  Problem Relation Age of Onset  . Hypertension Mother   . Hypertension Father   . Malignant hyperthermia Neg Hx     History  Substance Use Topics  . Smoking status: Never Smoker   . Smokeless tobacco: Not on file   Comment: Occasional cigars only  . Alcohol Use: 1.2 oz/week    2 Cans of beer per week      Review of Systems  Constitutional: Positive for fever, chills and appetite change.    HENT: Negative for neck pain.   Respiratory: Negative for cough and shortness of breath.   Cardiovascular: Negative for chest pain.  Gastrointestinal: Positive for nausea, vomiting and abdominal pain. Negative for diarrhea and constipation.  Skin: Negative for rash.    Allergies  Review of patient's allergies indicates no known allergies.  Home Medications   Current Outpatient Rx  Name Route Sig Dispense Refill  . LISINOPRIL 20 MG PO TABS Oral Take 20 mg by mouth daily.      BP 146/66  Pulse 104  Temp(Src) 101.5 F (38.6 C) (Oral)  Resp 24  SpO2 95%  Physical Exam  Nursing note and vitals reviewed. Constitutional: He is oriented to person, place, and time. He appears well-developed and well-nourished. No distress.  HENT:  Head: Normocephalic and atraumatic.  Neck: Normal range of motion. Neck supple.       No crepitus  Cardiovascular: Regular rhythm.  Tachycardia present.   Pulmonary/Chest: Effort normal and breath sounds normal. No respiratory distress. He has no wheezes. He has no rales.  Abdominal: Soft. He exhibits no distension. There is tenderness in the right upper quadrant, epigastric area and left upper quadrant. There is no rigidity, no rebound, no guarding and no tenderness at McBurney's point.  Neurological: He is alert and oriented to person, place, and time.  Skin: Skin is warm.  Psychiatric: He has a normal mood and affect. His behavior  is normal.    ED Course  Procedures    Labs Reviewed  CBC  DIFFERENTIAL  COMPREHENSIVE METABOLIC PANEL  LIPASE, BLOOD   Ct Abdomen Pelvis W Contrast  07/02/2011  *RADIOLOGY REPORT*  Clinical Data: Severe epigastric abdominal pain, nausea and vomiting.  Endoscopy with stent removal on 06/28/2011.  CT ABDOMEN AND PELVIS WITH CONTRAST  Technique:  Multidetector CT imaging of the abdomen and pelvis was performed following the standard protocol during bolus administration of intravenous contrast.  Contrast: OMNIPAQUE  IOHEXOL 300 MG/ML IJ SOLN  Comparison: Abdominal series 07/02/2011  Findings: Atelectasis or focal infiltration in the lung bases.  Surgical absence of the gallbladder.  Mild bile duct dilatation with intrahepatic and extrahepatic ductal dilatation.  Maximal diameter of the common hepatic duct is about 12 mm.  This is nonspecific and could represent the early obstruction or physiologic change in the postcholecystectomy state.  Surgical clip versus focal calcification in the medial segment of the left lobe of the liver.  Surgical clips versus suture material in the perihepatic fat.  Small accessory spleen.  The pancreas, spleen, adrenal glands, kidneys, abdominal aorta, and retroperitoneal lymph nodes are unremarkable.  The stomach, small bowel, and colon are not distended and no wall thickening is visualized.  No free air or free fluid in the abdomen.  Pelvis:  The bladder wall is not thickened.  Mild enlargement of the prostate gland and seminal vesicles.  There appear to be surgical clips in the pelvis.  No free or loculated pelvic fluid collections.  No inflammatory changes associated with the sigmoid colon.  No significant pelvic lymphadenopathy.  The appendix is not specifically identified but no inflammatory changes are shown in the right lower quadrant.  Degenerative changes at the lumbosacral interspace.  No destructive bone lesions appreciated.  IMPRESSION: Nonspecific bile duct dilatation which may be postoperative although early obstruction is not excluded.  No evidence of pancreatitis.  Scattered surgical clips or suture material demonstrated in the medial segment left lobe the liver and in the fat adjacent to the lateral aspect of the liver.  No free air.  No free fluid.  Original Report Authenticated By: Marlon Pel, M.D.   Dg Abd Acute W/chest  07/02/2011  *RADIOLOGY REPORT*  Clinical Data: Recent ERCP.  Abdominal pain, nausea.  Question free air.  ACUTE ABDOMEN SERIES (ABDOMEN 2 VIEW &  CHEST 1 VIEW)  Comparison: Chest x-ray 11/11/2009  Findings: Bibasilar opacities, left slightly greater than right, likely atelectasis.  Recommend clinical correlation to exclude infection in the left lung base.  Heart is borderline in size.  No visible effusions.  No free air.  Gas within nondistended large and small bowel.  Prior cholecystectomy.  Moderate stool burden in the colon.  No suspicious calcification or acute bony abnormality.  IMPRESSION: Bibasilar opacities, left greater than right, likely atelectasis. Recommend clinical correlation to exclude left basilar pneumonia.  Borderline heart size.  No evidence of bowel obstruction or free air.  Original Report Authenticated By: Cyndie Chime, M.D.     1. Cholangitis       MDM  12:30 AM patient seen and evaluated. Patient no acute distress but appears very comfortable. She with recent ERCP and presents with upper abdominal pain, nausea vomiting and fever. Concerns for possible pancreatitis versus perforation. Will begin workup and treat symptoms.   Patient discussed with attending physician. Concerns for cholangitis. We'll plan to call hospitalist for admission.  Spoke with tried hospitalist. They will see patient and  admit.     Angus Seller, [place] 07/02/11 2115

## 2011-07-02 NOTE — Consult Note (Signed)
[facility] CONSULT Reason for consult: Chills and elevated liver test Referring Physician: [facility]: PCP: Dr. Lavada Mesi, GI: Dr. Ambrose Mantle Jon Phillips is an 51 y.o. male.  HPI: Nice 51 year old gentleman who had cholecystectomy a number of years ago and several weeks ago began to turn yellow. He had an emergent ERCP with sphincterotomy and stent placement. 4 days ago he had the stent removed in EUS was performed. This showed a prominent cystic duct intraductal stone possibly some debris in the bile. Patient then had ERCP with sphincterotomy and removal of stone material I. Dr. Ewing Schlein. The stone was seen to be removed. Brushings of the bile duct were negative for neoplasia. The occlusion cholangiogram revealed no evidence of filling defects, biliary leakage. The patient felt fine until yesterday. He actually went to his son's athletic event at a barbecue sandwich. Yesterday evening he began to have chills this without fever and some nausea. I talked with the family last night and told the wife he felt worse to come to the emergency room. He was admitted last night due to worsening symptoms. His labs revealed total bilirubin 4.0, elevated AST ALT and alkaline phosphatase. Both of these have been normal prior to these procedures earlier in the week. White blood count was elevated at 14.3. He had a temperature of 101 and now is 99. A CT scan in the ER this morning revealed no evidence of pancreatitis and a 12 mm common bile it was nonspecific. No material seen. There were no free air or abscess. The patient was given a single dose of Levaquin in the ER.  Past Medical History  Diagnosis Date  . Hypertension     Past Surgical History  Procedure Date  . Cholecystectomy   . Back surgery   . Ercp 05/04/2011    Procedure: ENDOSCOPIC RETROGRADE CHOLANGIOPANCREATOGRAPHY (ERCP);  Surgeon: Petra Kuba, MD;  Location: [facility] ENDOSCOPY;  Service: Endoscopy;  Laterality: N/A;  fluoro  . Eus  06/28/2011    Procedure: LOWER ENDOSCOPIC ULTRASOUND (EUS);  Surgeon: Petra Kuba, MD;  Location: [facility] ENDOSCOPY;  Service: Endoscopy;  Laterality: N/A;  Nicole/Katy  Dr. Dulce Sellar to do EUS  . Ercp 06/28/2011    Procedure: ENDOSCOPIC RETROGRADE CHOLANGIOPANCREATOGRAPHY (ERCP);  Surgeon: Petra Kuba, MD;  Location: [facility] ENDOSCOPY;  Service: Endoscopy;  Laterality: N/A;  Dr. Ewing Schlein to do ERCP    Family History  Problem Relation Age of Onset  . Hypertension Mother   . Hypertension Father   . Malignant hyperthermia Neg Hx     Social History:  reports that he has never smoked. He does not have any smokeless tobacco history on file. He reports that he drinks about 1.2 ounces of alcohol per week. He reports that he does not use illicit drugs.  Allergies: No Known Allergies  Medications;    . enoxaparin  40 mg Subcutaneous Q24H  .  HYDROmorphone (DILAUDID) injection  1 mg Intravenous Once  . levofloxacin (LEVAQUIN) IV  750 mg Intravenous Once  . lisinopril  10 mg Oral Daily  . ondansetron (ZOFRAN) IV  4 mg Intravenous Once  . pantoprazole (PROTONIX) IV  40 mg Intravenous Q24H  . sodium chloride  1,000 mL Intravenous Once   PRN Meds acetaminophen, iohexol, morphine, ondansetron (ZOFRAN) IV, ondansetron, oxyCODONE Results for orders placed during the hospital encounter of 07/01/11 (from the past 48 hour(s))  CBC     Status: Abnormal   Collection Time   07/02/11 12:58 AM  Component Value Range Comment   WBC 14.3 (*) 4.0 - 10.5 (K/uL)    RBC 4.96  4.22 - 5.81 (MIL/uL)    Hemoglobin 15.1  13.0 - 17.0 (g/dL)    HCT 16.1  09.6 - 04.5 (%)    MCV 85.3  78.0 - 100.0 (fL)    MCH 30.4  26.0 - 34.0 (pg)    MCHC 35.7  30.0 - 36.0 (g/dL)    RDW 40.9  81.1 - 91.4 (%)    Platelets 199  150 - 400 (K/uL)   DIFFERENTIAL     Status: Abnormal   Collection Time   07/02/11 12:58 AM      Component Value Range Comment   Neutrophils Relative 90 (*) 43 - 77 (%)    Neutro Abs 13.0 (*) 1.7 - 7.7 (K/uL)     Lymphocytes Relative 4 (*) 12 - 46 (%)    Lymphs Abs 0.5 (*) 0.7 - 4.0 (K/uL)    Monocytes Relative 6  3 - 12 (%)    Monocytes Absolute 0.8  0.1 - 1.0 (K/uL)    Eosinophils Relative 0  0 - 5 (%)    Eosinophils Absolute 0.0  0.0 - 0.7 (K/uL)    Basophils Relative 0  0 - 1 (%)    Basophils Absolute 0.0  0.0 - 0.1 (K/uL)   COMPREHENSIVE METABOLIC PANEL     Status: Abnormal   Collection Time   07/02/11 12:58 AM      Component Value Range Comment   Sodium 139  135 - 145 (mEq/L)    Potassium 3.8  3.5 - 5.1 (mEq/L)    Chloride 101  96 - 112 (mEq/L)    CO2 26  19 - 32 (mEq/L)    Glucose, Bld 94  70 - 99 (mg/dL)    BUN 11  6 - 23 (mg/dL)    Creatinine, Ser 7.82  0.50 - 1.35 (mg/dL)    Calcium 9.7  8.4 - 10.5 (mg/dL)    Total Protein 7.7  6.0 - 8.3 (g/dL)    Albumin 4.3  3.5 - 5.2 (g/dL)    AST 956 (*) 0 - 37 (U/L) NO VISIBLE HEMOLYSIS   ALT 324 (*) 0 - 53 (U/L)    Alkaline Phosphatase 140 (*) 39 - 117 (U/L)    Total Bilirubin 4.0 (*) 0.3 - 1.2 (mg/dL)    GFR calc non Af Amer >90  >90 (mL/min)    GFR calc Af Amer >90  >90 (mL/min)   LIPASE, BLOOD     Status: Normal   Collection Time   07/02/11 12:58 AM      Component Value Range Comment   Lipase 20  11 - 59 (U/L)   URINALYSIS, ROUTINE W REFLEX MICROSCOPIC     Status: Abnormal   Collection Time   07/02/11  6:08 AM      Component Value Range Comment   Color, Urine AMBER (*) YELLOW  BIOCHEMICALS MAY BE AFFECTED BY COLOR   APPearance CLEAR  CLEAR     Specific Gravity, Urine >1.046 (*) 1.005 - 1.030     pH 7.0  5.0 - 8.0     Glucose, UA NEGATIVE  NEGATIVE (mg/dL)    Hgb urine dipstick NEGATIVE  NEGATIVE     Bilirubin Urine SMALL (*) NEGATIVE     Ketones, ur 15 (*) NEGATIVE (mg/dL)    Protein, ur NEGATIVE  NEGATIVE (mg/dL)    Urobilinogen, UA 1.0  0.0 - 1.0 (mg/dL)  Nitrite NEGATIVE  NEGATIVE     Leukocytes, UA NEGATIVE  NEGATIVE  MICROSCOPIC NOT DONE ON URINES WITH NEGATIVE PROTEIN, BLOOD, LEUKOCYTES, NITRITE, OR GLUCOSE <1000 mg/dL.      Ct Abdomen Pelvis W Contrast  07/02/2011  *RADIOLOGY REPORT*  Clinical Data: Severe epigastric abdominal pain, nausea and vomiting.  Endoscopy with stent removal on 06/28/2011.  CT ABDOMEN AND PELVIS WITH CONTRAST  Technique:  Multidetector CT imaging of the abdomen and pelvis was performed following the standard protocol during bolus administration of intravenous contrast.  Contrast: OMNIPAQUE IOHEXOL 300 MG/ML IJ SOLN  Comparison: Abdominal series 07/02/2011  Findings: Atelectasis or focal infiltration in the lung bases.  Surgical absence of the gallbladder.  Mild bile duct dilatation with intrahepatic and extrahepatic ductal dilatation.  Maximal diameter of the common hepatic duct is about 12 mm.  This is nonspecific and could represent the early obstruction or physiologic change in the postcholecystectomy state.  Surgical clip versus focal calcification in the medial segment of the left lobe of the liver.  Surgical clips versus suture material in the perihepatic fat.  Small accessory spleen.  The pancreas, spleen, adrenal glands, kidneys, abdominal aorta, and retroperitoneal lymph nodes are unremarkable.  The stomach, small bowel, and colon are not distended and no wall thickening is visualized.  No free air or free fluid in the abdomen.  Pelvis:  The bladder wall is not thickened.  Mild enlargement of the prostate gland and seminal vesicles.  There appear to be surgical clips in the pelvis.  No free or loculated pelvic fluid collections.  No inflammatory changes associated with the sigmoid colon.  No significant pelvic lymphadenopathy.  The appendix is not specifically identified but no inflammatory changes are shown in the right lower quadrant.  Degenerative changes at the lumbosacral interspace.  No destructive bone lesions appreciated.  IMPRESSION: Nonspecific bile duct dilatation which may be postoperative although early obstruction is not excluded.  No evidence of pancreatitis.  Scattered  surgical clips or suture material demonstrated in the medial segment left lobe the liver and in the fat adjacent to the lateral aspect of the liver.  No free air.  No free fluid.  Original Report Authenticated By: Marlon Pel, M.D.   Dg Abd Acute W/chest  07/02/2011  *RADIOLOGY REPORT*  Clinical Data: Recent ERCP.  Abdominal pain, nausea.  Question free air.  ACUTE ABDOMEN SERIES (ABDOMEN 2 VIEW & CHEST 1 VIEW)  Comparison: Chest x-ray 11/11/2009  Findings: Bibasilar opacities, left slightly greater than right, likely atelectasis.  Recommend clinical correlation to exclude infection in the left lung base.  Heart is borderline in size.  No visible effusions.  No free air.  Gas within nondistended large and small bowel.  Prior cholecystectomy.  Moderate stool burden in the colon.  No suspicious calcification or acute bony abnormality.  IMPRESSION: Bibasilar opacities, left greater than right, likely atelectasis. Recommend clinical correlation to exclude left basilar pneumonia.  Borderline heart size.  No evidence of bowel obstruction or free air.  Original Report Authenticated By: Cyndie Chime, M.D.              Blood pressure 113/67, pulse 84, temperature 99.1 F (37.3 C), temperature source Oral, resp. rate 24, height 6' 3.2" (1.91 m), weight 108.9 kg (240 lb 1.3 oz), SpO2 96.00%.  Physical exam:   White male lying in the hospital bed appears to not feel well but in no severe distress.  Eyes-nonicteric Lungs-clear Abdomen-soft good bowel sounds mild epigastric tenderness  Assessment: 1. Cholangitis-this may well be due to edema and passage of some stone material following his recent sphincterotomy extension. He did quite well for 3 days. This may improve without further intervention were may need for placement of a biliary stent.   Plan: 1 we'll place the patient on Levaquin 750 every 24 hours. We'll keep him n.p.o. and follow his labs and clinical course closely. He may well  need to go ahead and replace his biliary stent. Have discussed this in detail with the patient and his wife.   Niels Cranshaw JR,Rudolfo Brandow L 07/02/2011, 7:48 AM

## 2011-07-02 NOTE — Progress Notes (Signed)
Elray Mcgregor NP for [facility] paged with + blood culture results.  Await return call.  No new orders at this time pt currently on iv antibiotics

## 2011-07-02 NOTE — Progress Notes (Signed)
Due to lack of anaerobic coverage, we will add metronidazole to the Levaquin until the blood cultures have returned.

## 2011-07-02 NOTE — H&P (Signed)
PCP:   Lillia Carmel, MD, MD  GI Dr. Dulce Sellar  Chief Complaint:  Abdominal pain, nausea vomiting  HPI: This a 51 year old gentleman, who states 60 days ago, he turned yellow. He had an emergent ERCP with stent placement. Patient stent was removed on Tuesday, March 26 by Dr. Dulce Sellar. A gallstone was also retrieved. Patient states he felt good  Wednesday and Thursday. He stated that at lunch he felt 'off'. 8:30 PM after dinner he developed nausea vomiting, no hematemesis. He developed fevers and chills and came to the ER. He denies any back pain or diarrhea. Abdominal pain is not severe, just generalized. He also feels bloated. He has a history of cholecystectomy approximately 10 years ago. History provided by the patient, his wife at the bedside.  Review of Systems: Positives bolded  The patient denies anorexia, fever, weight loss,, vision loss, decreased hearing, hoarseness, chest pain, syncope, dyspnea on exertion, peripheral edema, balance deficits, hemoptysis, abdominal pain, melena, hematochezia, severe indigestion/heartburn, hematuria, incontinence, genital sores, muscle weakness, suspicious skin lesions, transient blindness, difficulty walking, depression, unusual weight change, abnormal bleeding, enlarged lymph nodes, angioedema, and breast masses.  Past Medical History: Past Medical History  Diagnosis Date  . Hypertension    Past Surgical History  Procedure Date  . Cholecystectomy   . Back surgery   . Ercp 05/04/2011    Procedure: ENDOSCOPIC RETROGRADE CHOLANGIOPANCREATOGRAPHY (ERCP);  Surgeon: Petra Kuba, MD;  Location: [facility] ENDOSCOPY;  Service: Endoscopy;  Laterality: N/A;  fluoro  . Eus 06/28/2011    Procedure: LOWER ENDOSCOPIC ULTRASOUND (EUS);  Surgeon: Petra Kuba, MD;  Location: [facility] ENDOSCOPY;  Service: Endoscopy;  Laterality: N/A;  Nicole/Katy  Dr. Dulce Sellar to do EUS  . Ercp 06/28/2011    Procedure: ENDOSCOPIC RETROGRADE CHOLANGIOPANCREATOGRAPHY (ERCP);  Surgeon: Petra Kuba,  MD;  Location: [facility] ENDOSCOPY;  Service: Endoscopy;  Laterality: N/A;  Dr. Ewing Schlein to do ERCP    Medications: Prior to Admission medications   Medication Sig Start Date End Date Taking? Authorizing Provider  lisinopril (PRINIVIL,ZESTRIL) 20 MG tablet Take 20 mg by mouth daily.   Yes Historical Provider, MD    Allergies:  No Known Allergies  Social History:  reports that he has never smoked. He does not have any smokeless tobacco history on file. He reports that he drinks about 1.2 ounces of alcohol per week. He reports that he does not use illicit drugs.  Family History: Family History  Problem Relation Age of Onset  . Hypertension Mother   . Hypertension Father   . Malignant hyperthermia Neg Hx     Physical Exam: Filed Vitals:   07/02/11 0245 07/02/11 0300 07/02/11 0315 07/02/11 0330  BP: 104/46 103/64 110/60 117/53  Pulse: 87 86 89 89  Temp:      TempSrc:      Resp:      SpO2: 97% 96% 95% 94%    General:  Alert and oriented times three, well developed and nourished, no acute distress Eyes: PERRLA, pink conjunctiva, no scleral icterus ENT: Moist oral mucosa, neck supple, no thyromegaly Lungs: clear to ascultation, no wheeze, no crackles, no use of accessory muscles Cardiovascular: regular rate and rhythm, no regurgitation, no gallops, no murmurs. No carotid bruits, no JVD Abdomen: soft, positive BS, distended with mild tenderness to palpation, no organomegaly, not an acute abdomen GU: not examined Neuro: CN II - XII grossly intact, sensation intact Musculoskeletal: strength 5/5 all extremities, no clubbing, cyanosis or edema Skin: no rash, no subcutaneous crepitation, no decubitus  Psych: appropriate patient   Labs on Admission:   [facility] 07/02/11 0058  NA 139  K 3.8  CL 101  CO2 26  GLUCOSE 94  BUN 11  CREATININE 0.95  CALCIUM 9.7  MG --  PHOS --    Basename 07/02/11 0058  AST 466*  ALT 324*  ALKPHOS 140*  BILITOT 4.0*  PROT 7.7  ALBUMIN 4.3     Basename 07/02/11 0058  LIPASE 20  AMYLASE --    Basename 07/02/11 0058  WBC 14.3*  NEUTROABS 13.0*  HGB 15.1  HCT 42.3  MCV 85.3  PLT 199   No results found for this basename: CKTOTAL:3,CKMB:3,CKMBINDEX:3,TROPONINI:3 in the last 72 hours No components found with this basename: POCBNP:3 No results found for this basename: DDIMER:2 in the last 72 hours No results found for this basename: HGBA1C:2 in the last 72 hours No results found for this basename: CHOL:2,HDL:2,LDLCALC:2,TRIG:2,CHOLHDL:2,LDLDIRECT:2 in the last 72 hours No results found for this basename: TSH,T4TOTAL,FREET3,T3FREE,THYROIDAB in the last 72 hours No results found for this basename: VITAMINB12:2,FOLATE:2,FERRITIN:2,TIBC:2,IRON:2,RETICCTPCT:2 in the last 72 hours  Micro Results: No results found for this or any previous visit (from the past 240 hour(s)).   Radiological Exams on Admission: Ct Abdomen Pelvis W Contrast  07/02/2011  *RADIOLOGY REPORT*  Clinical Data: Severe epigastric abdominal pain, nausea and vomiting.  Endoscopy with stent removal on 06/28/2011.  CT ABDOMEN AND PELVIS WITH CONTRAST  Technique:  Multidetector CT imaging of the abdomen and pelvis was performed following the standard protocol during bolus administration of intravenous contrast.  Contrast: OMNIPAQUE IOHEXOL 300 MG/ML IJ SOLN  Comparison: Abdominal series 07/02/2011  Findings: Atelectasis or focal infiltration in the lung bases.  Surgical absence of the gallbladder.  Mild bile duct dilatation with intrahepatic and extrahepatic ductal dilatation.  Maximal diameter of the common hepatic duct is about 12 mm.  This is nonspecific and could represent the early obstruction or physiologic change in the postcholecystectomy state.  Surgical clip versus focal calcification in the medial segment of the left lobe of the liver.  Surgical clips versus suture material in the perihepatic fat.  Small accessory spleen.  The pancreas, spleen, adrenal  glands, kidneys, abdominal aorta, and retroperitoneal lymph nodes are unremarkable.  The stomach, small bowel, and colon are not distended and no wall thickening is visualized.  No free air or free fluid in the abdomen.  Pelvis:  The bladder wall is not thickened.  Mild enlargement of the prostate gland and seminal vesicles.  There appear to be surgical clips in the pelvis.  No free or loculated pelvic fluid collections.  No inflammatory changes associated with the sigmoid colon.  No significant pelvic lymphadenopathy.  The appendix is not specifically identified but no inflammatory changes are shown in the right lower quadrant.  Degenerative changes at the lumbosacral interspace.  No destructive bone lesions appreciated.  IMPRESSION: Nonspecific bile duct dilatation which may be postoperative although early obstruction is not excluded.  No evidence of pancreatitis.  Scattered surgical clips or suture material demonstrated in the medial segment left lobe the liver and in the fat adjacent to the lateral aspect of the liver.  No free air.  No free fluid.  Original Report Authenticated By: Marlon Pel, M.D.   Dg Abd Acute W/chest  07/02/2011  *RADIOLOGY REPORT*  Clinical Data: Recent ERCP.  Abdominal pain, nausea.  Question free air.  ACUTE ABDOMEN SERIES (ABDOMEN 2 VIEW & CHEST 1 VIEW)  Comparison: Chest x-ray 11/11/2009  Findings: Bibasilar opacities,  left slightly greater than right, likely atelectasis.  Recommend clinical correlation to exclude infection in the left lung base.  Heart is borderline in size.  No visible effusions.  No free air.  Gas within nondistended large and small bowel.  Prior cholecystectomy.  Moderate stool burden in the colon.  No suspicious calcification or acute bony abnormality.  IMPRESSION: Bibasilar opacities, left greater than right, likely atelectasis. Recommend clinical correlation to exclude left basilar pneumonia.  Borderline heart size.  No evidence of bowel obstruction  or free air.  Original Report Authenticated By: Cyndie Chime, M.D.    Assessment/Plan Present on Admission:  .Acute cholangitis Transaminitis Hyperbilirubinemia Admit to MedSurg  Blood cultures collected and antibiotics started  Consult GI, patient may likely need repeat ERCP Premedication as needed, n.p.o. Hypertension Blood pressure medications    Full code DVT prophylaxis Team 5/Dr. Lauretta Chester, Vitor Overbaugh 07/02/2011, 5:25 AM

## 2011-07-03 ENCOUNTER — Encounter (HOSPITAL_COMMUNITY)
Admission: EM | Disposition: A | Discharge status: Home / Self Care | Payer: Self-pay | Source: Home / Self Care | Attending: Internal Medicine

## 2011-07-03 ENCOUNTER — Encounter (HOSPITAL_COMMUNITY): Payer: Self-pay | Admitting: *Deleted

## 2011-07-03 ENCOUNTER — Inpatient Hospital Stay (HOSPITAL_COMMUNITY): Payer: BC Managed Care – PPO

## 2011-07-03 HISTORY — PX: ERCP: SHX5425

## 2011-07-03 LAB — CBC
HCT: 34.8 % — ABNORMAL LOW (ref 39.0–52.0)
MCH: 30.4 pg (ref 26.0–34.0)
MCV: 86.8 fL (ref 78.0–100.0)
Platelets: 138 10*3/uL — ABNORMAL LOW (ref 150–400)
RBC: 4.01 MIL/uL — ABNORMAL LOW (ref 4.22–5.81)
RDW: 13.6 % (ref 11.5–15.5)

## 2011-07-03 LAB — COMPREHENSIVE METABOLIC PANEL
AST: 102 U/L — ABNORMAL HIGH (ref 0–37)
Albumin: 3 g/dL — ABNORMAL LOW (ref 3.5–5.2)
Alkaline Phosphatase: 95 U/L (ref 39–117)
Chloride: 106 mEq/L (ref 96–112)
Potassium: 3.7 mEq/L (ref 3.5–5.1)
Total Bilirubin: 6.3 mg/dL — ABNORMAL HIGH (ref 0.3–1.2)

## 2011-07-03 LAB — SURGICAL PCR SCREEN
MRSA, PCR: NEGATIVE
Staphylococcus aureus: NEGATIVE

## 2011-07-03 SURGERY — ERCP, WITH INTERVENTION IF INDICATED
Anesthesia: Moderate Sedation

## 2011-07-03 MED ORDER — FENTANYL CITRATE 0.05 MG/ML IJ SOLN
INTRAMUSCULAR | Status: DC | PRN
  Administered 2011-07-03 (×6): 25 ug via INTRAVENOUS

## 2011-07-03 MED ORDER — LISINOPRIL 10 MG PO TABS
10.0000 mg | ORAL_TABLET | Freq: Every day | ORAL | Status: DC
  Administered 2011-07-03 – 2011-07-04 (×2): 10 mg via ORAL
  Filled 2011-07-03 (×3): qty 1

## 2011-07-03 MED ORDER — SODIUM CHLORIDE 0.9 % IV SOLN
INTRAVENOUS | Status: DC | PRN
  Administered 2011-07-03: 17:00:00

## 2011-07-03 MED ORDER — MIDAZOLAM HCL 10 MG/2ML IJ SOLN
INTRAMUSCULAR | Status: DC | PRN
  Administered 2011-07-03: 1 mg via INTRAVENOUS
  Administered 2011-07-03 (×3): 2 mg via INTRAVENOUS
  Administered 2011-07-03: 1 mg via INTRAVENOUS
  Administered 2011-07-03: 2 mg via INTRAVENOUS

## 2011-07-03 MED ORDER — BUTAMBEN-TETRACAINE-BENZOCAINE 2-2-14 % EX AERO
INHALATION_SPRAY | CUTANEOUS | Status: DC | PRN
  Administered 2011-07-03: 2 via TOPICAL

## 2011-07-03 MED ORDER — SODIUM CHLORIDE 0.9 % IV SOLN
INTRAVENOUS | Status: DC
  Administered 2011-07-03 – 2011-07-04 (×2): via INTRAVENOUS

## 2011-07-03 MED ORDER — SODIUM CHLORIDE 0.9 % IV SOLN: Freq: Once | INTRAVENOUS | Status: DC

## 2011-07-03 MED ORDER — DIPHENHYDRAMINE HCL 50 MG/ML IJ SOLN
INTRAMUSCULAR | Status: DC | PRN
  Administered 2011-07-03 (×2): 25 mg via INTRAVENOUS

## 2011-07-03 MED ORDER — SODIUM CHLORIDE 0.9 % IV SOLN
1.5000 g | Freq: Once | INTRAVENOUS | Status: DC
  Filled 2011-07-03: qty 1.5

## 2011-07-03 MED ORDER — SODIUM CHLORIDE 0.9 % IV SOLN
INTRAVENOUS | Status: AC
  Filled 2011-07-03: qty 1.5

## 2011-07-03 NOTE — Progress Notes (Signed)
Pt has order for discharge patient not to be discharged. Discharge order is part of an order set and is not to be discharged spoke with Dr Randa Evens and he confirmed there is not discharge and part of order set and discharge is for being discharged from endo and not hospital. Informed Physician needs to choose inpatient order set and not outpatient order set for future. Annitta Needs, RN

## 2011-07-03 NOTE — Op Note (Signed)
[facility] [address] [place], [place]  [address]  ERCP PROCEDURE REPORT  PATIENT:  Jon, Phillips  MR#:  [id number] BIRTHDATE:  10-24-1960  GENDER:  male  ENDOSCOPIST:  Wandalee Ferdinand, MD ASSISTANT:  Kandice Robinsons, Loleta Rose. 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  The Pentax ERCP [id number] endoscope was introduced through the mouth and advanced to the second portion of the duodenum.  The region of the papilla of Vater was located with obvious findings of prior sphincterotomy. Bile was draining into the duodenal lumen. The sphincterotomy  orifice looked large and patent. Selective cannulation of the biliary tree was achieved using a guidewire and sphincterotome. Initially the guidewire went up the cystic duct which appeared dilated and contrast was injected filling the cystic duct as well as the biliary tree. The guidewire was then pulled out of the cystic duct and repositioned into the common bile duct and common hepatic duct and further contrast was injected. There were a lot of air bubbles creating the appearance of filling defects. The sphincterotome was then removed and a balloon catheter was advanced over the guidewire and up into the proximal biliary tree the balloon was dilated to 12 mm and further contrast was injected as the balloon was pulled through the common bile duct and out the sphincterotomy site. No ston were exracted. There was no evidence of purulent material. Because of the clinical picture and because of the  prior suggestion of distal bile duct stricture I proceeded with placing a plastic biliary stent. A 7 cm 8.5 [place] biliary stent was placed into the common bile duct without difficulty.  The scope was then completely withdrawn from the patient and the procedure terminated. <<PROCEDUREIMAGES>>  COMPLICATIONS:  None  ENDOSCOPIC IMPRESSION:  #1 jaundice  #2 gram-negative sepsis  #3 history of biliary stones  #4 prior suggestion of distal biliary stricture  #5 biliary stent placed RECOMMENDATIONS: Continue current therapy with antibiotics. Observe response to replacement of biliary stent.  ______________________________ Wandalee Ferdinand, MD  CC:  n. eSIGNED:   Sam Domanick Cuccia at 07/03/2011 05:15 PM  Page 2 of 3   Jodean Lima, [phone]

## 2011-07-03 NOTE — Progress Notes (Signed)
[facility] PROGRESS NOTE Subjective the patient reports that he is still feeling bad and having  and abdominal pain but no longer having chills. Blood cultures were positive for Grm neg rods.  Objective: Vital signs in last 24 hours: Temp:  [97.8 F (36.6 C)-98.7 F (37.1 C)] 98.6 F (37 C) (03/31 0500) Pulse Rate:  [71-92] 71  (03/31 0500) Resp:  [18] 18  (03/31 0500) BP: (99-123)/(53-68) 123/68 mmHg (03/31 0500) SpO2:  [94 %-96 %] 96 % (03/31 0500) Last BM Date: 07/01/11  Intake/Output from previous day: 03/30 0701 - 03/31 0700 In: 1754.2 [I.V.:1304.2; IV Piggyback:450] Out: 400 [Urine:400] Intake/Output this shift:    PE: Alert no distress Abd-soft, + BSs, mild if any EG tenderness  Lab Results:  [facility] 07/03/11 0427 07/02/11 0058  WBC 11.7* 14.3*  HGB 12.2* 15.1  HCT 34.8* 42.3  PLT 138* 199   BMET  Basename 07/03/11 0427 07/02/11 0058  NA 140 139  K 3.7 3.8  CL 106 101  CO2 25 26  CREATININE 0.98 0.95   LFT  Basename 07/03/11 0427 07/02/11 0058  PROT 6.1 7.7  AST 102* 466*  ALT 165* 324*  ALKPHOS 95 140*  BILITOT 6.3* 4.0*  BILIDIR -- --  IBILI -- --   PT/INR No results found for this basename: LABPROT:3,INR:3 in the last 72 hours PANCREAS  Basename 07/02/11 0058  LIPASE 20         Studies/Results: Ct Abdomen Pelvis W Contrast  07/02/2011  *RADIOLOGY REPORT*  Clinical Data: Severe epigastric abdominal pain, nausea and vomiting.  Endoscopy with stent removal on 06/28/2011.  CT ABDOMEN AND PELVIS WITH CONTRAST  Technique:  Multidetector CT imaging of the abdomen and pelvis was performed following the standard protocol during bolus administration of intravenous contrast.  Contrast: OMNIPAQUE IOHEXOL 300 MG/ML IJ SOLN  Comparison: Abdominal series 07/02/2011  Findings: Atelectasis or focal infiltration in the lung bases.  Surgical absence of the gallbladder.  Mild bile duct dilatation with intrahepatic and extrahepatic ductal  dilatation.  Maximal diameter of the common hepatic duct is about 12 mm.  This is nonspecific and could represent the early obstruction or physiologic change in the postcholecystectomy state.  Surgical clip versus focal calcification in the medial segment of the left lobe of the liver.  Surgical clips versus suture material in the perihepatic fat.  Small accessory spleen.  The pancreas, spleen, adrenal glands, kidneys, abdominal aorta, and retroperitoneal lymph nodes are unremarkable.  The stomach, small bowel, and colon are not distended and no wall thickening is visualized.  No free air or free fluid in the abdomen.  Pelvis:  The bladder wall is not thickened.  Mild enlargement of the prostate gland and seminal vesicles.  There appear to be surgical clips in the pelvis.  No free or loculated pelvic fluid collections.  No inflammatory changes associated with the sigmoid colon.  No significant pelvic lymphadenopathy.  The appendix is not specifically identified but no inflammatory changes are shown in the right lower quadrant.  Degenerative changes at the lumbosacral interspace.  No destructive bone lesions appreciated.  IMPRESSION: Nonspecific bile duct dilatation which may be postoperative although early obstruction is not excluded.  No evidence of pancreatitis.  Scattered surgical clips or suture material demonstrated in the medial segment left lobe the liver and in the fat adjacent to the lateral aspect of the liver.  No free air.  No free fluid.  Original Report Authenticated By: Marlon Pel, M.D.   Dg  Abd Acute W/chest  07/02/2011  *RADIOLOGY REPORT*  Clinical Data: Recent ERCP.  Abdominal pain, nausea.  Question free air.  ACUTE ABDOMEN SERIES (ABDOMEN 2 VIEW & CHEST 1 VIEW)  Comparison: Chest x-ray 11/11/2009  Findings: Bibasilar opacities, left slightly greater than right, likely atelectasis.  Recommend clinical correlation to exclude infection in the left lung base.  Heart is borderline in size.   No visible effusions.  No free air.  Gas within nondistended large and small bowel.  Prior cholecystectomy.  Moderate stool burden in the colon.  No suspicious calcification or acute bony abnormality.  IMPRESSION: Bibasilar opacities, left greater than right, likely atelectasis. Recommend clinical correlation to exclude left basilar pneumonia.  Borderline heart size.  No evidence of bowel obstruction or free air.  Original Report Authenticated By: Cyndie Chime, M.D.    Medications: I have reviewed the patient's current medications.  Assessment/Plan: 1. Cholangitis. Feels better but positive blood cultures and increasing bilirubin.  Will need placement of biliary stent and is scheduled for 3 pm with Dr Evette Cristal. Pt aware of procedure and agrees. Will continue the antibiotics.   Dashawn Golda JR,Devaney Segers L 07/03/2011, 9:08 AM

## 2011-07-03 NOTE — Progress Notes (Signed)
Subjective: Complaining of abdominal pain. No vomiting; still with some nausea. Billirubin is higher today and patient with jaundice. No fever, no CP and no SOB.  Objective: Vital signs in last 24 hours: Temp:  [97.8 F (36.6 C)-98.7 F (37.1 C)] 98.6 F (37 C) (03/31 0500) Pulse Rate:  [71-92] 71  (03/31 0500) Resp:  [18] 18  (03/31 0500) BP: (99-123)/(53-68) 123/68 mmHg (03/31 0500) SpO2:  [94 %-96 %] 96 % (03/31 0500) Weight change:  Last BM Date: 07/01/11  Intake/Output from previous day: 03/30 0701 - 03/31 0700 In: 1754.2 [I.V.:1304.2; IV Piggyback:450] Out: 400 [Urine:400]     Physical Exam: General: Alert, awake, oriented x3, in mild distress.Jaundice present on exam. HEENT: No bruits, no goiter. Heart: Regular rate and rhythm, without murmurs, rubs, gallops. Lungs: Clear to auscultation bilaterally. Abdomen: Soft, RUQ and epigastric tenderness (control now with pain meds), nondistended, positive bowel sounds. Extremities: No clubbing, cyanosis or edema with positive pedal pulses. Neuro: Grossly intact, nonfocal.  Lab Results: Basic Metabolic Panel:  Basename 07/03/11 0427 07/02/11 0058  NA 140 139  K 3.7 3.8  CL 106 101  CO2 25 26  GLUCOSE 88 94  BUN 13 11  CREATININE 0.98 0.95  CALCIUM 8.3* 9.7  MG -- --  PHOS -- --   Liver Function Tests:  [facility] 07/03/11 0427 07/02/11 0058  AST 102* 466*  ALT 165* 324*  ALKPHOS 95 140*  BILITOT 6.3* 4.0*  PROT 6.1 7.7  ALBUMIN 3.0* 4.3    Basename 07/02/11 0058  LIPASE 20  AMYLASE --   CBC:  Basename 07/03/11 0427 07/02/11 0058  WBC 11.7* 14.3*  NEUTROABS -- 13.0*  HGB 12.2* 15.1  HCT 34.8* 42.3  MCV 86.8 85.3  PLT 138* 199   Urinalysis:  Basename 07/02/11 0608  COLORURINE AMBER*  LABSPEC >1.046*  PHURINE 7.0  GLUCOSEU NEGATIVE  HGBUR NEGATIVE  BILIRUBINUR SMALL*  KETONESUR 15*  PROTEINUR NEGATIVE  UROBILINOGEN 1.0  NITRITE NEGATIVE  LEUKOCYTESUR NEGATIVE    Studies/Results: Ct  Abdomen Pelvis W Contrast  07/02/2011  *RADIOLOGY REPORT*  Clinical Data: Severe epigastric abdominal pain, nausea and vomiting.  Endoscopy with stent removal on 06/28/2011.  CT ABDOMEN AND PELVIS WITH CONTRAST  Technique:  Multidetector CT imaging of the abdomen and pelvis was performed following the standard protocol during bolus administration of intravenous contrast.  Contrast: OMNIPAQUE IOHEXOL 300 MG/ML IJ SOLN  Comparison: Abdominal series 07/02/2011  Findings: Atelectasis or focal infiltration in the lung bases.  Surgical absence of the gallbladder.  Mild bile duct dilatation with intrahepatic and extrahepatic ductal dilatation.  Maximal diameter of the common hepatic duct is about 12 mm.  This is nonspecific and could represent the early obstruction or physiologic change in the postcholecystectomy state.  Surgical clip versus focal calcification in the medial segment of the left lobe of the liver.  Surgical clips versus suture material in the perihepatic fat.  Small accessory spleen.  The pancreas, spleen, adrenal glands, kidneys, abdominal aorta, and retroperitoneal lymph nodes are unremarkable.  The stomach, small bowel, and colon are not distended and no wall thickening is visualized.  No free air or free fluid in the abdomen.  Pelvis:  The bladder wall is not thickened.  Mild enlargement of the prostate gland and seminal vesicles.  There appear to be surgical clips in the pelvis.  No free or loculated pelvic fluid collections.  No inflammatory changes associated with the sigmoid colon.  No significant pelvic lymphadenopathy.  The appendix is  not specifically identified but no inflammatory changes are shown in the right lower quadrant.  Degenerative changes at the lumbosacral interspace.  No destructive bone lesions appreciated.  IMPRESSION: Nonspecific bile duct dilatation which may be postoperative although early obstruction is not excluded.  No evidence of pancreatitis.  Scattered surgical  clips or suture material demonstrated in the medial segment left lobe the liver and in the fat adjacent to the lateral aspect of the liver.  No free air.  No free fluid.  Original Report Authenticated By: Marlon Pel, M.D.   Dg Abd Acute W/chest  07/02/2011  *RADIOLOGY REPORT*  Clinical Data: Recent ERCP.  Abdominal pain, nausea.  Question free air.  ACUTE ABDOMEN SERIES (ABDOMEN 2 VIEW & CHEST 1 VIEW)  Comparison: Chest x-ray 11/11/2009  Findings: Bibasilar opacities, left slightly greater than right, likely atelectasis.  Recommend clinical correlation to exclude infection in the left lung base.  Heart is borderline in size.  No visible effusions.  No free air.  Gas within nondistended large and small bowel.  Prior cholecystectomy.  Moderate stool burden in the colon.  No suspicious calcification or acute bony abnormality.  IMPRESSION: Bibasilar opacities, left greater than right, likely atelectasis. Recommend clinical correlation to exclude left basilar pneumonia.  Borderline heart size.  No evidence of bowel obstruction or free air.  Original Report Authenticated By: Cyndie Chime, M.D.    Medications: Scheduled Meds:    . enoxaparin (LOVENOX) injection  50 mg Subcutaneous Q24H  . levofloxacin (LEVAQUIN) IV  750 mg Intravenous Q24H  . metronidazole  500 mg Intravenous Q8H  . pantoprazole (PROTONIX) IV  40 mg Intravenous Q24H  . DISCONTD: lisinopril  10 mg Oral Daily   Continuous Infusions:    . 0.9 % NaCl with KCl 20 mEq / L 125 mL/hr at 07/02/11 1234   PRN Meds:.acetaminophen, morphine, ondansetron (ZOFRAN) IV, ondansetron, oxyCODONE, DISCONTD: morphine  Assessment/Plan: 1-Acute cholangitis: most likely associated with recent ERCP and some debris material or stone passing through. Blood cx positive for gram negative rods; will continue antibiotics, continue IVF. LFT's improving but bilirubin continue rising. Per GI biliary stent to be replaced today at 3:00pm. Continue NPO  status  2-Hypertension: stable and well controlled; since he is NPO; and BP stable w/o antihypertensive drugs, will continue holding BP meds.  3-Nausea, transaminitis and hyperbilirubinemia: associated with #1. Will use PRN antiemetics; continue supportive care and follow GI recommendations and patient symptoms evolution.   4-Gram neg rods bacteremia: continue levaquin and flagyl Iv; most likely E. Coli; but speciation pending. Patient afebrile.   LOS: 2 days   Lavina Resor [facility] [phone]  07/03/2011, 9:59 AM

## 2011-07-04 ENCOUNTER — Encounter (HOSPITAL_COMMUNITY): Payer: Self-pay | Admitting: Gastroenterology

## 2011-07-04 ENCOUNTER — Encounter (HOSPITAL_COMMUNITY): Payer: Self-pay

## 2011-07-04 LAB — CULTURE, BLOOD (ROUTINE X 2): Culture  Setup Time: 201303301113

## 2011-07-04 MED ORDER — LISINOPRIL 20 MG PO TABS
20.0000 mg | ORAL_TABLET | Freq: Every day | ORAL | Status: DC
  Administered 2011-07-05: 20 mg via ORAL
  Filled 2011-07-04 (×2): qty 1

## 2011-07-04 NOTE — Progress Notes (Signed)
Patient ID: Jon Phillips, male   DOB: 08-Mar-1961, 51 y.o.   MRN: [id number] [facility] Progress Note  Melissa Pulido 51 y.o. 05/20/1960   Subjective: Resting comfortably. Easily arousable. Feels better than yesterday. Feels soreness in abdomen. Denies n/v. Tolerating clears. S/P biliary stent in ERCP yesterday by Dr. Evette Cristal.  Objective: Vital signs: Filed Vitals:   07/04/11 1016  BP: 149/88  Pulse: 69  Temp: 98.3  Resp: 18    Intake/Output last 24 hrs:  Intake/Output Summary (Last 24 hours) at 07/04/11 1256 Last data filed at 07/04/11 0454  Gross per 24 hour  Intake 1016.67 ml  Output   1500 ml  Net -483.33 ml    Physical Exam: Gen: alert, no acute distress  Abd: minimal epigastric tenderness with voluntary guarding, otherwise nontender, soft, nondistended, positive bowel sounds  Lab Results:  [facility] 07/03/11 0427 07/02/11 0058  NA 140 139  K 3.7 3.8  CL 106 101  CO2 25 26  GLUCOSE 88 94  BUN 13 11  CREATININE 0.98 0.95  CALCIUM 8.3* 9.7  MG -- --  PHOS -- --    Basename 07/03/11 0427 07/02/11 0058  AST 102* 466*  ALT 165* 324*  ALKPHOS 95 140*  BILITOT 6.3* 4.0*  PROT 6.1 7.7  ALBUMIN 3.0* 4.3    Basename 07/03/11 0427 07/02/11 0058  WBC 11.7* 14.3*  NEUTROABS -- 13.0*  HGB 12.2* 15.1  HCT 34.8* 42.3  MCV 86.8 85.3  PLT 138* 199       Assessment/Plan: 51yo man with cholangitis s/p biliary stent yesterday. AST/ALT improving. TB increased since yesterday but peak is usually delayed. Doing ok post-ERCP. Will not advance diet today but consider advancing slowly tomorrow if doing ok. Patient also says he has no desire to eat today. Follow labs closely. Continue antibiotics.   Joshlyn Beadle C. 07/04/2011, 12:56 PM

## 2011-07-04 NOTE — Progress Notes (Addendum)
Subjective: Complaining of some soreness in his belly but overall feeling a lot better and tolerating clear liquids. Afebrile and w/o N/V.  Objective: Vital signs in last 24 hours: Temp:  [97.3 F (36.3 C)-98.6 F (37 C)] 98.3 F (36.8 C) (04/01 0625) Pulse Rate:  [69-78] 69  (04/01 0625) Resp:  [16-34] 18  (04/01 0625) BP: (119-160)/(67-103) 149/88 mmHg (04/01 1016) SpO2:  [92 %-100 %] 92 % (04/01 0625) Weight change:  Last BM Date: 07/01/11  Intake/Output from previous day: 03/31 0701 - 04/01 0700 In: 1016.7 [I.V.:1016.7] Out: 1500 [Urine:1500]     Physical Exam: General: Alert, awake, oriented x3, NAD. Jaundice present on exam. HEENT: No bruits, no goiter. Heart: Regular rate and rhythm, without murmurs, rubs, gallops. Lungs: Clear to auscultation bilaterally. Abdomen: Soft, RUQ and epigastric area sore to deep palpation. Nondistended, positive bowel sounds. Extremities: No clubbing, cyanosis or edema with positive pedal pulses. Neuro: Grossly intact, nonfocal.  Lab Results: Basic Metabolic Panel:  Basename 07/03/11 0427 07/02/11 0058  NA 140 139  K 3.7 3.8  CL 106 101  CO2 25 26  GLUCOSE 88 94  BUN 13 11  CREATININE 0.98 0.95  CALCIUM 8.3* 9.7  MG -- --  PHOS -- --   Liver Function Tests:  [facility] 07/03/11 0427 07/02/11 0058  AST 102* 466*  ALT 165* 324*  ALKPHOS 95 140*  BILITOT 6.3* 4.0*  PROT 6.1 7.7  ALBUMIN 3.0* 4.3    Basename 07/02/11 0058  LIPASE 20  AMYLASE --   CBC:  Basename 07/03/11 0427 07/02/11 0058  WBC 11.7* 14.3*  NEUTROABS -- 13.0*  HGB 12.2* 15.1  HCT 34.8* 42.3  MCV 86.8 85.3  PLT 138* 199   Urinalysis:  Basename 07/02/11 0608  COLORURINE AMBER*  LABSPEC >1.046*  PHURINE 7.0  GLUCOSEU NEGATIVE  HGBUR NEGATIVE  BILIRUBINUR SMALL*  KETONESUR 15*  PROTEINUR NEGATIVE  UROBILINOGEN 1.0  NITRITE NEGATIVE  LEUKOCYTESUR NEGATIVE    Studies/Results: Dg C-arm 61-120 Min  07/03/2011  *RADIOLOGY REPORT*  Clinical  Data: ERCP with stent placement.  C-ARM 61-120 MIN  Comparison: None.  Findings: Single fluoroscopic spot film demonstrates a biliary stent in the abdomen, presumably within the right upper quadrant. There may be some residual contrast in the dilated biliary system. This single image is suboptimal for visualization.  IMPRESSION: As above.  Original Report Authenticated By: Andreas Newport, M.D.    Medications: Scheduled Meds:    . enoxaparin (LOVENOX) injection  50 mg Subcutaneous Q24H  . levofloxacin (LEVAQUIN) IV  750 mg Intravenous Q24H  . lisinopril  10 mg Oral Daily  . metronidazole  500 mg Intravenous Q8H  . pantoprazole (PROTONIX) IV  40 mg Intravenous Q24H  . DISCONTD: sodium chloride   Intravenous Once  . DISCONTD: ampicillin-sulbactam (UNASYN) 1.5 g IVPB  1.5 g Intravenous Once   Continuous Infusions:    . sodium chloride 100 mL/hr at 07/03/11 1023   PRN Meds:.acetaminophen, morphine, Omnipaque 350 mg/mL (50 mL) in 0.9% normal saline (50 mL), ondansetron (ZOFRAN) IV, ondansetron, oxyCODONE, DISCONTD: butamben-tetracaine-benzocaine, DISCONTD: diphenhydrAMINE, DISCONTD: fentaNYL, DISCONTD: midazolam  Assessment/Plan: 1-Acute cholangitis: most likely associated with recent ERCP and some debris material or stone passing through. Blood cx positive for gram negative rods; will continue antibiotics, continue IVF. S/P biliary stent. Doing better; will follow GI recommendations.  2-Hypertension: stable; but a little high. Will resume home meds.  3-Nausea, transaminitis and hyperbilirubinemia: associated with #1. Will continue PRN antiemetics and supportive care. Symptoms improved after biliary stent  replace. Patient tolerating clear liquid diet.   4-Enterobacter cloacae bacteremia: continue levaquin and flagyl IV; Discussed with ID (Dr. Luciana Axe) recommendations is for 2 weeks therapy using flagyl and levaquin. Patient afebrile.   LOS: 3 days   Jon Phillips [facility] [phone]  07/04/2011, 1:59 PM

## 2011-07-05 LAB — CBC
Hemoglobin: 12.4 g/dL — ABNORMAL LOW (ref 13.0–17.0)
MCH: 30.2 pg (ref 26.0–34.0)
MCV: 86.6 fL (ref 78.0–100.0)
RBC: 4.11 MIL/uL — ABNORMAL LOW (ref 4.22–5.81)
WBC: 9 10*3/uL (ref 4.0–10.5)

## 2011-07-05 LAB — COMPREHENSIVE METABOLIC PANEL
Albumin: 2.8 g/dL — ABNORMAL LOW (ref 3.5–5.2)
Alkaline Phosphatase: 115 U/L (ref 39–117)
BUN: 9 mg/dL (ref 6–23)
Calcium: 8.6 mg/dL (ref 8.4–10.5)
Potassium: 3.7 mEq/L (ref 3.5–5.1)
Sodium: 137 mEq/L (ref 135–145)
Total Protein: 6.3 g/dL (ref 6.0–8.3)

## 2011-07-05 MED ORDER — HYDROCODONE-ACETAMINOPHEN 5-500 MG PO TABS: 1.0000 | ORAL_TABLET | Freq: Four times a day (QID) | ORAL | Status: AC | PRN

## 2011-07-05 MED ORDER — METRONIDAZOLE 500 MG PO TABS: 500.0000 mg | ORAL_TABLET | Freq: Three times a day (TID) | ORAL | Status: AC

## 2011-07-05 MED ORDER — LEVOFLOXACIN 750 MG PO TABS: 750.0000 mg | ORAL_TABLET | Freq: Every day | ORAL | Status: AC

## 2011-07-05 MED ORDER — PROMETHAZINE HCL 12.5 MG PO TABS: 12.5000 mg | ORAL_TABLET | Freq: Four times a day (QID) | ORAL | Status: DC | PRN

## 2011-07-05 NOTE — Progress Notes (Signed)
Patient provided with discharge instructions and prescriptions. Patient verbalized understanding. Patient discharged to home. 

## 2011-07-05 NOTE — Progress Notes (Signed)
ANTIBIOTIC CONSULT NOTE - FOLLOW UP  Pharmacy Consult for Levaquin Indication: Cholangitis, enterobacter bacteremia  No Known Allergies  Patient Measurements: Height: 6' 3.2" (191 cm) Weight: 240 lb 8.4 oz (109.1 kg) IBW/kg (Calculated) : 84.95   Vital Signs: Temp: 98.5 F (36.9 C) (04/02 0618) Temp src: Oral (04/02 0618) BP: 115/64 mmHg (04/02 0618) Pulse Rate: 62  (04/02 0618) Intake/Output from previous day: 04/01 0701 - 04/02 0700 In: 623.6 [I.V.:373.6; IV Piggyback:250] Out: 1850 [Urine:1850] Intake/Output from this shift:    Labs:  Basename 07/05/11 0357 07/03/11 0427  WBC 9.0 11.7*  HGB 12.4* 12.2*  PLT 169 138*  LABCREA -- --  CREATININE 0.92 0.98   Estimated Creatinine Clearance: 125.7 ml/min (by C-G formula based on Cr of 0.92).  Assessment:  51 YOM on Day 4 levaquin q24h and Flagyl per MD for cholangitis and enterobacter bacteremia.  GI biliary stent placed 3/31.  MD discussed with ID and recommended 2 weeks of therapy with Flagyl and Levaquin.  Scr stable and CrCl has been > 100 ml/min  Afebrile, WBC wnl  Plan:   Continue antibiotics for a total of 14 days as recommended by ID (to end 07/15/11)  Pharmacy will sign off given stable renal function.  Geoffry Paradise Thi 07/05/2011,9:56 AM

## 2011-07-05 NOTE — Discharge Summary (Signed)
Physician Discharge Summary  Patient ID: Jon Phillips MRN: [id number] DOB/AGE: 04-23-1960 51 y.o.  Admit date: 07/01/2011 Discharge date: 07/05/2011  Primary Care Physician:  Lillia Carmel, MD, MD   Discharge Diagnoses:   1-acute cholangitis (S/P biliary stent replacement) 2-enterobacter cloacae bacteremia 3-Abdominal pain, N/V (due to #1) 4-Hypokalemia 5-Mild dehydration 6-Leukocytosis 2/2 cholangitis 7-HTN   Medication List  As of 07/05/2011 10:27 AM   TAKE these medications         HYDROcodone-acetaminophen 5-500 MG per tablet   Commonly known as: VICODIN   Take 1 tablet by mouth every 6 (six) hours as needed for pain.      levofloxacin 750 MG tablet   Commonly known as: LEVAQUIN   Take 1 tablet (750 mg total) by mouth daily.      lisinopril 20 MG tablet   Commonly known as: PRINIVIL,ZESTRIL   Take 20 mg by mouth daily.      metroNIDAZOLE 500 MG tablet   Commonly known as: FLAGYL   Take 1 tablet (500 mg total) by mouth 3 (three) times daily.      promethazine 12.5 MG tablet   Commonly known as: PHENERGAN   Take 1 tablet (12.5 mg total) by mouth every 6 (six) hours as needed for nausea.             Disposition and Follow-up:  Patient discharge in stable and improved condition; currently w/o significant abdominal pain, N/V; he will follow with PCP in 2 weeks and also with GI (Dr. Dulce Sellar) in 2-3 weeks. Patient advised to follow a low fat diet and to take antibiotics as instructed (10 more days of flagyl and levaquin to complete 14 days treatment). Rest of medical problems remains stable and further adjustments to be done by PCP as needed during follow up.   Consults:   GI ID curbside (Dr. Luciana Axe)   Significant Diagnostic Studies:  Ct Abdomen Pelvis W Contrast  07/02/2011  *RADIOLOGY REPORT*  Clinical Data: Severe epigastric abdominal pain, nausea and vomiting.  Endoscopy with stent removal on 06/28/2011.  CT ABDOMEN AND PELVIS WITH CONTRAST  Technique:   Multidetector CT imaging of the abdomen and pelvis was performed following the standard protocol during bolus administration of intravenous contrast.  Contrast: OMNIPAQUE IOHEXOL 300 MG/ML IJ SOLN  Comparison: Abdominal series 07/02/2011  Findings: Atelectasis or focal infiltration in the lung bases.  Surgical absence of the gallbladder.  Mild bile duct dilatation with intrahepatic and extrahepatic ductal dilatation.  Maximal diameter of the common hepatic duct is about 12 mm.  This is nonspecific and could represent the early obstruction or physiologic change in the postcholecystectomy state.  Surgical clip versus focal calcification in the medial segment of the left lobe of the liver.  Surgical clips versus suture material in the perihepatic fat.  Small accessory spleen.  The pancreas, spleen, adrenal glands, kidneys, abdominal aorta, and retroperitoneal lymph nodes are unremarkable.  The stomach, small bowel, and colon are not distended and no wall thickening is visualized.  No free air or free fluid in the abdomen.  Pelvis:  The bladder wall is not thickened.  Mild enlargement of the prostate gland and seminal vesicles.  There appear to be surgical clips in the pelvis.  No free or loculated pelvic fluid collections.  No inflammatory changes associated with the sigmoid colon.  No significant pelvic lymphadenopathy.  The appendix is not specifically identified but no inflammatory changes are shown in the right lower quadrant.  Degenerative changes at the lumbosacral  interspace.  No destructive bone lesions appreciated.  IMPRESSION: Nonspecific bile duct dilatation which may be postoperative although early obstruction is not excluded.  No evidence of pancreatitis.  Scattered surgical clips or suture material demonstrated in the medial segment left lobe the liver and in the fat adjacent to the lateral aspect of the liver.  No free air.  No free fluid.  Original Report Authenticated By: Marlon Pel, M.D.    Dg Abd Acute W/chest  07/02/2011  *RADIOLOGY REPORT*  Clinical Data: Recent ERCP.  Abdominal pain, nausea.  Question free air.  ACUTE ABDOMEN SERIES (ABDOMEN 2 VIEW & CHEST 1 VIEW)  Comparison: Chest x-ray 11/11/2009  Findings: Bibasilar opacities, left slightly greater than right, likely atelectasis.  Recommend clinical correlation to exclude infection in the left lung base.  Heart is borderline in size.  No visible effusions.  No free air.  Gas within nondistended large and small bowel.  Prior cholecystectomy.  Moderate stool burden in the colon.  No suspicious calcification or acute bony abnormality.  IMPRESSION: Bibasilar opacities, left greater than right, likely atelectasis. Recommend clinical correlation to exclude left basilar pneumonia.  Borderline heart size.  No evidence of bowel obstruction or free air.  Original Report Authenticated By: Cyndie Chime, M.D.    Brief H and P: 51 year old gentleman, who states 60 days ago, he turned yellow. He had an emergent ERCP with stent placement. Patient stent was removed on Tuesday, March 26 by Dr. Dulce Sellar. A gallstone was also retrieved. Patient states he felt good Wednesday and Thursday. He stated that at lunch he felt 'off'. 8:30 PM after dinner he developed nausea vomiting, no hematemesis. He developed fevers and chills and came to the ER. He denies any back pain or diarrhea.  Hospital Course:  1-acute cholangitis (S/P biliary stent replacement): now doing ok and w/o significant abdominal pain, N/V. Will continue antibiotics; instructed to follow a low fat diet and to follow with Dr. Dulce Sellar in 2-3 weeks for further management and decision on his biliary stent.  2-enterobacter cloacae bacteremia: per Id recommendation continue flagyl and levaquin until 07/15/11. Patient afebrile for 48 hours now; tolerating PO and w/o elevated WBC's.  3-Abdominal pain, N/V (due to #1): pretty much resolved. Will continue advancing his diet to low fat diet and will  give 30 tabs of vicodin for pain control if needed.  4-Hypokalemia: repleted. Associated with mild dehydration and volume contraction.  5-Mild dehydration: resolved with IVF's resuscitation.  6-Leukocytosis 2/2 cholangitis: resolved with antibiotics.  7-HTN: stable continue lisinopril.   Time spent on Discharge: 40 minutes  Signed: Tonee Silverstein 07/05/2011, 10:27 AM

## 2011-09-26 ENCOUNTER — Ambulatory Visit (HOSPITAL_COMMUNITY)
Admission: RE | Admit: 2011-09-26 | Discharge: 2011-09-26 | Disposition: A | Discharge status: Home / Self Care | Payer: BC Managed Care – PPO | Source: Ambulatory Visit | Attending: Gastroenterology | Admitting: Gastroenterology

## 2011-09-26 ENCOUNTER — Encounter (HOSPITAL_COMMUNITY): Payer: Self-pay | Admitting: *Deleted

## 2011-09-26 NOTE — Anesthesia Preprocedure Evaluation (Signed)
Anesthesia Evaluation  Patient identified by MRN, date of birth, ID band Patient awake    Reviewed: Allergy & Precautions, H&P , NPO status , Patient's Chart, lab work & pertinent test results  Airway Mallampati: II TM Distance: >3 FB Neck ROM: Full    Dental No notable dental hx. (+) Teeth Intact and Dental Advisory Given   Pulmonary  No smoking history. breath sounds clear to auscultation  Pulmonary exam normal       Cardiovascular hypertension, Pt. on medications Rhythm:Regular Rate:Normal     Neuro/Psych negative neurological ROS  negative psych ROS   GI/Hepatic negative GI ROS, Neg liver ROS,   Endo/Other  negative endocrine ROS  Renal/GU negative Renal ROS  negative genitourinary   Musculoskeletal negative musculoskeletal ROS (+)   Abdominal   Peds negative pediatric ROS (+)  Hematology negative hematology ROS (+)   Anesthesia Other Findings   Reproductive/Obstetrics negative OB ROS                           Anesthesia Physical Anesthesia Plan  ASA: II  Anesthesia Plan: General   Post-op Pain Management:    Induction: Intravenous  Airway Management Planned: Oral ETT  Additional Equipment:   Intra-op Plan:   Post-operative Plan: Extubation in OR  Informed Consent: I have reviewed the patients History and Physical, chart, labs and discussed the procedure including the risks, benefits and alternatives for the proposed anesthesia with the patient or authorized representative who has indicated his/her understanding and acceptance.   Dental advisory given  Plan Discussed with: CRNA  Anesthesia Plan Comments:         Anesthesia Quick Evaluation

## 2011-09-28 ENCOUNTER — Ambulatory Visit (HOSPITAL_COMMUNITY)
Admission: RE | Admit: 2011-09-28 | Discharge: 2011-09-28 | Disposition: A | Discharge status: Home / Self Care | Payer: BC Managed Care – PPO | Source: Ambulatory Visit | Attending: Gastroenterology | Admitting: Gastroenterology

## 2011-09-28 ENCOUNTER — Ambulatory Visit (HOSPITAL_COMMUNITY): Payer: BC Managed Care – PPO

## 2011-09-28 ENCOUNTER — Encounter (HOSPITAL_COMMUNITY): Payer: Self-pay | Admitting: Anesthesiology

## 2011-09-28 ENCOUNTER — Ambulatory Visit (HOSPITAL_COMMUNITY): Payer: BC Managed Care – PPO | Admitting: Anesthesiology

## 2011-09-28 ENCOUNTER — Encounter (HOSPITAL_COMMUNITY)
Admission: RE | Disposition: A | Discharge status: Home / Self Care | Payer: Self-pay | Source: Ambulatory Visit | Attending: Gastroenterology

## 2011-09-28 DIAGNOSIS — I10 Essential (primary) hypertension: Secondary | ICD-10-CM | POA: Insufficient documentation

## 2011-09-28 DIAGNOSIS — K838 Other specified diseases of biliary tract: Secondary | ICD-10-CM | POA: Insufficient documentation

## 2011-09-28 DIAGNOSIS — K8070 Calculus of gallbladder and bile duct without cholecystitis without obstruction: Secondary | ICD-10-CM | POA: Insufficient documentation

## 2011-09-28 DIAGNOSIS — Z79899 Other long term (current) drug therapy: Secondary | ICD-10-CM | POA: Insufficient documentation

## 2011-09-28 DIAGNOSIS — K831 Obstruction of bile duct: Secondary | ICD-10-CM | POA: Insufficient documentation

## 2011-09-28 HISTORY — PX: ERCP: SHX5425

## 2011-09-28 HISTORY — PX: EUS: SHX5427

## 2011-09-28 HISTORY — PX: SPYGLASS CHOLANGIOSCOPY: SHX5441

## 2011-09-28 HISTORY — PX: FINE NEEDLE ASPIRATION: SHX5430

## 2011-09-28 SURGERY — ERCP, WITH INTERVENTION IF INDICATED
Anesthesia: General

## 2011-09-28 MED ORDER — ONDANSETRON HCL 4 MG/2ML IJ SOLN
INTRAMUSCULAR | Status: DC | PRN
  Administered 2011-09-28 (×2): 2 mg via INTRAVENOUS

## 2011-09-28 MED ORDER — LIDOCAINE HCL (CARDIAC) 20 MG/ML IV SOLN
INTRAVENOUS | Status: DC | PRN
  Administered 2011-09-28: 75 mg via INTRAVENOUS

## 2011-09-28 MED ORDER — GLYCOPYRROLATE 0.2 MG/ML IJ SOLN
INTRAMUSCULAR | Status: DC | PRN
  Administered 2011-09-28: .8 mg via INTRAVENOUS

## 2011-09-28 MED ORDER — LACTATED RINGERS IV SOLN
INTRAVENOUS | Status: DC | PRN
  Administered 2011-09-28 (×3): via INTRAVENOUS

## 2011-09-28 MED ORDER — CISATRACURIUM BESYLATE (PF) 10 MG/5ML IV SOLN
INTRAVENOUS | Status: DC | PRN
  Administered 2011-09-28 (×2): 2 mg via INTRAVENOUS
  Administered 2011-09-28: 6 mg via INTRAVENOUS
  Administered 2011-09-28: 2 mg via INTRAVENOUS

## 2011-09-28 MED ORDER — PROMETHAZINE HCL 25 MG/ML IJ SOLN: 6.2500 mg | INTRAMUSCULAR | Status: DC | PRN

## 2011-09-28 MED ORDER — LACTATED RINGERS IV SOLN
INTRAVENOUS | Status: DC
  Administered 2011-09-28: 07:00:00 via INTRAVENOUS

## 2011-09-28 MED ORDER — MEPERIDINE HCL 100 MG/ML IJ SOLN: 6.2500 mg | INTRAMUSCULAR | Status: DC | PRN

## 2011-09-28 MED ORDER — DEXTROSE 5 % IV SOLN
1.0000 g | Freq: Two times a day (BID) | INTRAVENOUS | Status: DC
  Administered 2011-09-28: 1 g via INTRAVENOUS
  Filled 2011-09-28 (×2): qty 1

## 2011-09-28 MED ORDER — EPHEDRINE SULFATE 50 MG/ML IJ SOLN
INTRAMUSCULAR | Status: DC | PRN
  Administered 2011-09-28 (×3): 5 mg via INTRAVENOUS

## 2011-09-28 MED ORDER — PROPOFOL 10 MG/ML IV EMUL
INTRAVENOUS | Status: DC | PRN
  Administered 2011-09-28: 200 mg via INTRAVENOUS

## 2011-09-28 MED ORDER — LIDOCAINE HCL 4 % MT SOLN
OROMUCOSAL | Status: DC | PRN
  Administered 2011-09-28: 4 mL via TOPICAL

## 2011-09-28 MED ORDER — MIDAZOLAM HCL 5 MG/5ML IJ SOLN
INTRAMUSCULAR | Status: DC | PRN
  Administered 2011-09-28 (×2): 1 mg via INTRAVENOUS

## 2011-09-28 MED ORDER — LACTATED RINGERS IV SOLN: INTRAVENOUS | Status: DC

## 2011-09-28 MED ORDER — DEXAMETHASONE SODIUM PHOSPHATE 10 MG/ML IJ SOLN
INTRAMUSCULAR | Status: DC | PRN
  Administered 2011-09-28: 10 mg via INTRAVENOUS

## 2011-09-28 MED ORDER — SUCCINYLCHOLINE CHLORIDE 20 MG/ML IJ SOLN
INTRAMUSCULAR | Status: DC | PRN
  Administered 2011-09-28: 120 mg via INTRAVENOUS

## 2011-09-28 MED ORDER — DEXTROSE 5 % IV SOLN
1.0000 g | INTRAVENOUS | Status: DC | PRN
  Administered 2011-09-28: 1 g via INTRAVENOUS

## 2011-09-28 MED ORDER — FENTANYL CITRATE 0.05 MG/ML IJ SOLN
INTRAMUSCULAR | Status: DC | PRN
  Administered 2011-09-28 (×2): 50 ug via INTRAVENOUS

## 2011-09-28 MED ORDER — NEOSTIGMINE METHYLSULFATE 1 MG/ML IJ SOLN
INTRAMUSCULAR | Status: DC | PRN
  Administered 2011-09-28: 5 mg via INTRAVENOUS

## 2011-09-28 NOTE — Transfer of Care (Signed)
Immediate Anesthesia Transfer of Care Note  Patient: Jon Phillips  Procedure(s) Performed: Procedure(s) (LRB): ENDOSCOPIC RETROGRADE CHOLANGIOPANCREATOGRAPHY (ERCP) (N/A) SPYGLASS CHOLANGIOSCOPY (N/A) ESOPHAGEAL ENDOSCOPIC ULTRASOUND (EUS) RADIAL (N/A)  Patient Location: PACU  Anesthesia Type: General  Level of Consciousness: awake, alert , oriented and patient cooperative  Airway & Oxygen Therapy: Patient Spontanous Breathing and Patient connected to face mask oxygen  Post-op Assessment: Report given to PACU RN, Post -op Vital signs reviewed and stable and Patient moving all extremities  Post vital signs: Reviewed and stable  Complications: No apparent anesthesia complications

## 2011-09-28 NOTE — Anesthesia Postprocedure Evaluation (Signed)
  Anesthesia Post-op Note  Patient: Jon Phillips  Procedure(s) Performed: Procedure(s) (LRB): ENDOSCOPIC RETROGRADE CHOLANGIOPANCREATOGRAPHY (ERCP) (N/A) SPYGLASS CHOLANGIOSCOPY (N/A) ESOPHAGEAL ENDOSCOPIC ULTRASOUND (EUS) RADIAL (N/A) FINE NEEDLE ASPIRATION (FNA) LINEAR ()  Patient Location: PACU  Anesthesia Type: General  Level of Consciousness: awake and alert   Airway and Oxygen Therapy: Patient Spontanous Breathing  Post-op Pain: mild  Post-op Assessment: Post-op Vital signs reviewed, Patient's Cardiovascular Status Stable, Respiratory Function Stable, Patent Airway and No signs of Nausea or vomiting  Post-op Vital Signs: stable  Complications: No apparent anesthesia complications

## 2011-09-28 NOTE — Discharge Instructions (Addendum)
Call if question or problems or recurrent pain yellow jaundice fever nausea or vomiting  Endoscopic Retrograde Cholangiopancreatography, Care After Please read the instructions outlined below and refer to this sheet in the next few weeks. These discharge instructions provide you with general information on caring for yourself after you leave the hospital. Your doctor may also give you specific instructions. While your treatment has been planned according to the most current medical practices available, unavoidable complications occasionally occur. If you have any problems or questions after discharge, please call your doctor. HOME CARE INSTRUCTIONS Activity  You may resume your regular activity but move at a slower pace for the next 24 hours.   Take frequent rest periods for the next 24 hours.   Walking will help expel (get rid of) the air and reduce the bloated feeling in your abdomen.   No driving for 24 hours (because of the anesthesia (medicine) used during the test).   You may shower.   Do not sign any important legal documents or operate any machinery for 24 hours (because of the anesthesia used during the test).  Nutrition  Drink plenty of fluids.   You may resume your normal diet.   Begin with a light meal and progress to your normal diet.   Avoid alcoholic beverages for 24 hours or as instructed by your caregiver.  Medications You may resume your normal medications unless your caregiver tells you otherwise. What you can expect today  You may experience abdominal discomfort such as a feeling of fullness or "gas" pains.   You may experience a sore throat for 2 to 3 days. This is normal. Gargling with salt water may help this.  Follow-up Your doctor will discuss the results of your test with you. SEEK IMMEDIATE MEDICAL CARE IF:  You have excessive nausea (feeling sick to your stomach) and/or vomiting.   You have severe abdominal pain and distention (swelling).   You  have trouble swallowing.   You have a temperature over 100 F (37.8 C).   You have rectal bleeding or vomiting of blood.  Document Released: 11/03/2003 Document Revised: 03/10/2011 Document Reviewed: 05/16/2007 [facility] Patient Information 2012 [place], [place].  Sedation or Anesthesia, Adult Care After The medicines you received during the procedure or test today can sometimes cause you to be:  Confused.   Dizzy.   Sleepy.   Clumsy.  HOME CARE INSTRUCTIONS  Do not engage in any activity that requires you to be alert or coordinated for the next 24 hours. This includes driving, operating heavy machinery, using power tools, cooking, climbing, or riding a bicycle.  No swimming, hot tubs, or baths for the next 24 hours.   Stay with family, friends, or a caregiver for the next 24 hours.   Do not make any important decisions in the next 24 hours, such as signing contracts, making important commitments, or making expensive purchases.   Do not drink alcohol for 24 hours.   Avoid solid food if you feel sick to your stomach (have nausea) or if you vomit.   Drink plenty of fluids.   Only take over-the-counter or prescription medicines for pain, discomfort, or fever as directed by your caregiver.   Call your caregiver if you have persistent nausea or if you vomit more than once.   If you cannot reach your caregiver, go to or contact the emergency department.  SEEK IMMEDIATE MEDICAL CARE IF:   You vomit more than once.   You have strange or unusual behavior.  You have a fever.   You have difficulty urinating.   You have severe pain.  Document Released: 03/21/2005 Document Revised: 03/10/2011 Document Reviewed: 04/28/2008 [facility] Patient Information 2012 [place], [place].

## 2011-09-28 NOTE — Op Note (Signed)
[facility] [address] [place], [place]  [address]  ERCP PROCEDURE REPORT  PATIENT:  Burdette, Forehand  MR#:  [id number] BIRTHDATE:  October 04, 1960  GENDER:  male  ENDOSCOPIST:  Vida Rigger, MD ASSISTANT:  Dwain Sarna, RN CGRN, Janae Sauce, RN, Kizzie Bane  PROCEDURE DATE:  09/28/2011 PROCEDURE:  ERCP w/ direct viz bile duct, ERCP with removal of stones, ERCP with stent placement ASA CLASS:  Class II  INDICATIONS:  stricture difficult to visualize  MEDICATIONS:  Per anesthesia TOPICAL ANESTHETIC: Not used  DESCRIPTION OF PROCEDURE:   After the risks benefits and alternatives of the procedure were thoroughly explained, informed consent was obtained.  The Pentax ERCP [id number] endoscope was introduced through the mouth and advanced to the second portion of the duodenum. Since he had a prior sphincterotomy cannulation was easy using all catheters and there were no PD injections and no PD wire advancements. We went ahead and initially cannulated the cystic duct and on balloon pull-through a small stone was withdrawn we then were able to cannulate the CBD and proceeded with a balloon pull-through which was normal on cholangiogram there was no obvious stricture however neither the cystic duct nor the CBD seemed to drain we then proceeded with spyglass in the customary fashion where a little debris was seen as well as a little scarring from the previous stent but no obvious stricture or other lesions we did proceed with spyglass in both the cystic duct as well as the CBD and distal common channel. Despite no significant findings because he did not seem to drain we elected to place a removable metal covered 10 French 6 cm stent in the customary fashion which did not have an obvious waste but was in the proper position both endoscopically and under fluoroscopy. We elected to stop the procedure at this point The examination was normal with no additional  endoscopic findings. The scope was then completely withdrawn from the patient and the procedure terminated. The patient tolerated the procedure well my partner Dr. Dulce Sellar was present for most of the above procedure <<PROCEDUREIMAGES>>  COMPLICATIONS:  A complication of none occurred on 09/28/2011 at.  ENDOSCOPIC IMPRESSION: #1 cystic duct stone status post #2 no obvious stricture or mass or other abnormality seen however not much biliary drainage #3 cholangiogram and spyglass without additional findings #410 [place] 6 cm removable covered metal stent placed in the customary fashion RECOMMENDATIONS: Observe for delayed complication followup when necessary or in one month and repeat labs and scheduled for stent removal in 2 months and have him call me sooner when necessary  ______________________________ Vida Rigger, MD  CC:  n. eSIGNEDVida Rigger at 09/28/2011 10:59 AM  Page 2 of 3   Jodean Lima, [phone]

## 2011-09-28 NOTE — H&P (Signed)
Cc:  Bile duct stricture  HPI:  51 yo wm with history of jaundice with distal bile duct stricture.  Prior EUS and ERCP few months ago confirming stricture, but no obvious evidence of malignancy.  After removal of initial stent, had fevers with concern for cholangitis, with replacement of biliary (plastic) stent.  At present, doing well without abdominal pain, nausea, vomiting, fevers.  Eating well without loss-of-appetite or weight loss.  PMHx:  Chole, HTN, back surgery, ERCP  MEDS:  Lisinopril  ALL:  NKDA  FHx:  No CRC, polyps  SocHx:  Married, Nature conservation officer, no smoking, occasional alcohol  ROS:  Per HPI  PE: GEN:  NAD HEENT:  Anicteric, Opal/AT LUNGS:  CTA CV:  RRR ABD:  Soft EXT:  No CCE NEURO:  Non-focal  Labs/Xrays:  As above  Impression: 1.  Bile duct stricture.  Clinical features most supportive of benign etiology at this time, but malignancy can't be definitively excluded.  Plan:  1.  Remove bile duct stent. 2.  Repeat Endoscopic ultrasound (EUS) with possible fine needle aspiration. 3.  Pending EUS findings, consider ERCP, consider cholangioscopy, consider (removable) wallstent placement. 4.  Risks (bleeding, infection, bowel perforation that could require surgery, sedation-related changes in cardiopulmonary systems), benefits (identification and possible treatment of source of symptoms, exclusion of certain causes of symptoms), and alternatives (watchful waiting, radiographic imaging studies, empiric medical treatment) of upper endoscopy with ultrasound (EUS) were explained to patient/wife in detail and patient wishes to proceed. 5.  Risks (up to and including bleeding, infection, perforation, pancreatitis that can be complicated by infected necrosis and death), benefits (removal of stones, alleviating blockage, decreasing risk of cholangitis or choledocholithiasis-related pancreatitis), and alternatives (watchful waiting, percutaneous transhepatic cholangiography) of  ERCP were explained to patient/wife in detail and patient elects to proceed.

## 2011-09-28 NOTE — Anesthesia Procedure Notes (Signed)
Procedure Name: Intubation Date/Time: 09/28/2011 7:51 AM Performed by: Edison Pace Pre-anesthesia Checklist: Patient identified, Timeout performed, Emergency Drugs available, Suction available and Patient being monitored Patient Re-evaluated:Patient Re-evaluated prior to inductionOxygen Delivery Method: Circle system utilized Preoxygenation: Pre-oxygenation with 100% oxygen Intubation Type: IV induction Ventilation: Mask ventilation without difficulty Laryngoscope Size: Mac and 4 Grade View: Grade II Number of attempts: 1 Airway Equipment and Method: Stylet Placement Confirmation: ETT inserted through vocal cords under direct vision,  positive ETCO2 and breath sounds checked- equal and bilateral Secured at: 21 cm Tube secured with: Tape Dental Injury: Teeth and Oropharynx as per pre-operative assessment

## 2011-09-28 NOTE — Op Note (Signed)
[facility] [address] [place], [place]  [address]  ENDOSCOPIC ULTRASOUND PROCEDURE REPORT  PATIENT:  Jon Phillips, Jon Phillips  MR#:  [id number] BIRTHDATE:  07/27/1960  GENDER:  male  ENDOSCOPIST:  Willis Modena, MD REFERRED BY:  Lavada Mesi, M.D. Vida Rigger, M.D.  PROCEDURE DATE:  09/28/2011 PROCEDURE:  Upper EUS w/FNA ASA CLASS:  Class II INDICATIONS:  bile duct stricture  MEDICATIONS:   general anesthesia, antibiotics (cefotetan)  DESCRIPTION OF PROCEDURE:   After the risks benefits and alternatives of the procedure were  explained, informed consent was obtained. The patient was then placed in the left, lateral, decubitus postion and IV sedation was administered. Throughout the procedure, the patient's blood pressure, pulse and oxygen saturations were monitored continuously.  Under direct visualization, the  endoscope was introduced through the mouth and advanced to the second portion of the duodenum.  Water was used as necessary to provide an acoustic interface.  Upon completion of the imaging, water was removed and the patient was sent to the recovery room in satisfactory condition.  <<PROCEDUREIMAGES>>  FINDINGS:  Using duodenoscope, previously placed biliary stent was removed with snare and sent in toto for cytologic analysis. Radial and linear EUS scopes were then used.  Prominent cystic duct again seen, with another cystic duct stone noted.  Long common biliary channel, housing CBD and cystic duct.  CBD proper showed diffuse mild wall thickening,likely reactive, otherwise normal. No obvious mass in head or uncinate pancreas.  Vague 12 x 10mm hypoechoic presence seen at level of ampulla; in light of clinical history, this area was biopsied (FNA) x 3 with 25g needle. Previously seen nodularity  versus debris in distal bile duct was no longer appreciated.  ENDOSCOPIC IMPRESSION:    1.  Hypoechoic region at level of ampulla; suspect physiologic; given  history, area was biopsied. 2.  Low cystic duct take-off with cystic duct stone. 3.  Diffusely narrow caliber bile duct with reactive-appearing symmetrical wall thickening.  RECOMMENDATIONS:      1.  Watch for potential complications of procedure. 2.  Await cytology results. 3.  Proceed with ERCP by Dr. Ewing Schlein now.  ______________________________ Willis Modena  CC:  n. eSIGNED:   Willis Modena at 09/28/2011 10:16 AM  Jodean Lima, [phone]

## 2011-09-28 NOTE — Preoperative (Signed)
Beta Blockers   Reason not to administer Beta Blockers:Not Applicable 

## 2011-09-29 ENCOUNTER — Encounter (HOSPITAL_COMMUNITY): Payer: Self-pay | Admitting: Gastroenterology

## 2011-11-21 ENCOUNTER — Encounter (HOSPITAL_COMMUNITY): Payer: Self-pay

## 2011-11-21 ENCOUNTER — Ambulatory Visit (HOSPITAL_COMMUNITY)
Admission: RE | Admit: 2011-11-21 | Discharge: 2011-11-21 | Disposition: A | Discharge status: Home / Self Care | Payer: BC Managed Care – PPO | Source: Ambulatory Visit | Attending: Gastroenterology | Admitting: Gastroenterology

## 2011-11-21 HISTORY — DX: Obstruction of bile duct: K83.1

## 2011-11-21 NOTE — Anesthesia Preprocedure Evaluation (Signed)
Anesthesia Evaluation  Patient identified by MRN, date of birth, ID band Patient awake    Reviewed: Allergy & Precautions, H&P , NPO status , Patient's Chart, lab work & pertinent test results  Airway Mallampati: II TM Distance: >3 FB Neck ROM: Full    Dental No notable dental hx. (+) Teeth Intact and Dental Advisory Given   Pulmonary  No smoking history. breath sounds clear to auscultation  Pulmonary exam normal       Cardiovascular hypertension, Pt. on medications Rhythm:Regular Rate:Normal     Neuro/Psych negative neurological ROS  negative psych ROS   GI/Hepatic negative GI ROS, Neg liver ROS,   Endo/Other  negative endocrine ROS  Renal/GU negative Renal ROS  negative genitourinary   Musculoskeletal negative musculoskeletal ROS (+)   Abdominal   Peds negative pediatric ROS (+)  Hematology negative hematology ROS (+)   Anesthesia Other Findings   Reproductive/Obstetrics negative OB ROS                           Anesthesia Physical Anesthesia Plan  ASA: II  Anesthesia Plan: General   Post-op Pain Management:    Induction: Intravenous  Airway Management Planned: Oral ETT  Additional Equipment:   Intra-op Plan:   Post-operative Plan: Extubation in OR  Informed Consent: I have reviewed the patients History and Physical, chart, labs and discussed the procedure including the risks, benefits and alternatives for the proposed anesthesia with the patient or authorized representative who has indicated his/her understanding and acceptance.   Dental advisory given  Plan Discussed with: CRNA  Anesthesia Plan Comments:         Anesthesia Quick Evaluation  

## 2011-11-22 ENCOUNTER — Encounter (HOSPITAL_COMMUNITY): Payer: Self-pay | Admitting: Anesthesiology

## 2011-11-22 ENCOUNTER — Encounter (HOSPITAL_COMMUNITY): Payer: Self-pay | Admitting: *Deleted

## 2011-11-22 ENCOUNTER — Ambulatory Visit (HOSPITAL_COMMUNITY)
Admission: RE | Admit: 2011-11-22 | Discharge: 2011-11-22 | Disposition: A | Discharge status: Home / Self Care | Payer: BC Managed Care – PPO | Source: Ambulatory Visit | Attending: Gastroenterology | Admitting: Gastroenterology

## 2011-11-22 ENCOUNTER — Ambulatory Visit (HOSPITAL_COMMUNITY): Payer: BC Managed Care – PPO | Admitting: Anesthesiology

## 2011-11-22 ENCOUNTER — Ambulatory Visit (HOSPITAL_COMMUNITY): Payer: BC Managed Care – PPO

## 2011-11-22 ENCOUNTER — Encounter (HOSPITAL_COMMUNITY)
Admission: RE | Disposition: A | Discharge status: Home / Self Care | Payer: Self-pay | Source: Ambulatory Visit | Attending: Gastroenterology

## 2011-11-22 DIAGNOSIS — I10 Essential (primary) hypertension: Secondary | ICD-10-CM | POA: Insufficient documentation

## 2011-11-22 DIAGNOSIS — K831 Obstruction of bile duct: Secondary | ICD-10-CM | POA: Insufficient documentation

## 2011-11-22 DIAGNOSIS — K8050 Calculus of bile duct without cholangitis or cholecystitis without obstruction: Secondary | ICD-10-CM | POA: Insufficient documentation

## 2011-11-22 HISTORY — PX: ERCP: SHX5425

## 2011-11-22 SURGERY — ERCP, WITH INTERVENTION IF INDICATED
Anesthesia: General

## 2011-11-22 MED ORDER — SODIUM CHLORIDE 0.9 % IV SOLN
INTRAVENOUS | Status: DC
  Administered 2011-11-22: 1000 mL via INTRAVENOUS

## 2011-11-22 MED ORDER — SODIUM CHLORIDE 0.9 % IV SOLN
INTRAVENOUS | Status: DC | PRN
  Administered 2011-11-22: 08:00:00

## 2011-11-22 MED ORDER — FENTANYL CITRATE 0.05 MG/ML IJ SOLN
INTRAMUSCULAR | Status: DC | PRN
  Administered 2011-11-22: 100 ug via INTRAVENOUS

## 2011-11-22 MED ORDER — CIPROFLOXACIN IN D5W 400 MG/200ML IV SOLN
INTRAVENOUS | Status: AC
  Filled 2011-11-22: qty 200

## 2011-11-22 MED ORDER — MIDAZOLAM HCL 5 MG/5ML IJ SOLN
INTRAMUSCULAR | Status: DC | PRN
  Administered 2011-11-22: 2 mg via INTRAVENOUS

## 2011-11-22 MED ORDER — PROPOFOL 10 MG/ML IV EMUL
INTRAVENOUS | Status: DC | PRN
  Administered 2011-11-22: 200 mg via INTRAVENOUS

## 2011-11-22 MED ORDER — LACTATED RINGERS IV SOLN: INTRAVENOUS | Status: DC

## 2011-11-22 MED ORDER — MEPERIDINE HCL 100 MG/ML IJ SOLN: 6.2500 mg | INTRAMUSCULAR | Status: DC | PRN

## 2011-11-22 MED ORDER — ONDANSETRON HCL 4 MG/2ML IJ SOLN
INTRAMUSCULAR | Status: DC | PRN
  Administered 2011-11-22: 4 mg via INTRAVENOUS

## 2011-11-22 MED ORDER — SODIUM CHLORIDE 0.9 % IV SOLN
INTRAVENOUS | Status: DC
  Administered 2011-11-22: 07:00:00 via INTRAVENOUS

## 2011-11-22 MED ORDER — ROCURONIUM BROMIDE 100 MG/10ML IV SOLN
INTRAVENOUS | Status: DC | PRN
  Administered 2011-11-22: 5 mg via INTRAVENOUS

## 2011-11-22 MED ORDER — CIPROFLOXACIN IN D5W 400 MG/200ML IV SOLN
INTRAVENOUS | Status: DC | PRN
  Administered 2011-11-22: 400 mg via INTRAVENOUS

## 2011-11-22 MED ORDER — LIDOCAINE HCL (CARDIAC) 20 MG/ML IV SOLN
INTRAVENOUS | Status: DC | PRN
  Administered 2011-11-22: 30 mg via INTRAVENOUS

## 2011-11-22 MED ORDER — PROMETHAZINE HCL 25 MG/ML IJ SOLN: 6.2500 mg | INTRAMUSCULAR | Status: DC | PRN

## 2011-11-22 MED ORDER — SUCCINYLCHOLINE CHLORIDE 20 MG/ML IJ SOLN
INTRAMUSCULAR | Status: DC | PRN
  Administered 2011-11-22: 100 mg via INTRAVENOUS

## 2011-11-22 NOTE — Progress Notes (Signed)
Jon Phillips 7:37 AM  Subjective: Patient is asymptomatic and has no new problems since we saw him in the office the other day Objective: Vital signs stable afebrile no acute distress exam normal please see pre-procedure assessment  Assessment: CBD stricture and CBD stones  Plan: Okay to proceed with ERCP stent removal and repeat cholangiogram  [facility]

## 2011-11-22 NOTE — Transfer of Care (Signed)
Immediate Anesthesia Transfer of Care Note  Patient: Jon Phillips  Procedure(s) Performed: Procedure(s) (LRB): ENDOSCOPIC RETROGRADE CHOLANGIOPANCREATOGRAPHY (ERCP) (N/A)  Patient Location: PACU  Anesthesia Type: General  Level of Consciousness: awake, alert , oriented and patient cooperative  Airway & Oxygen Therapy: Patient Spontanous Breathing and Patient connected to face mask oxygen  Post-op Assessment: Report given to PACU RN, Post -op Vital signs reviewed and stable and Patient moving all extremities X 4  Post vital signs: stable  Complications: No apparent anesthesia complications

## 2011-11-22 NOTE — Op Note (Signed)
[facility] [address] [place], [address]   ERCP PROCEDURE REPORT  PATIENT: Jon Phillips, Jon Phillips  MR# :[id number] BIRTHDATE: [birth year]  GENDER: Male ENDOSCOPIST: Vida Rigger, MD REFERRED BY: Lavada Mesi, M.D. PROCEDURE DATE:  11/22/2011 PROCEDURE:   ERCP with stent removal an occlusion cholangiogram x2 ASA CLASS:   1 INDICATIONS:CBD stricture and CBD stones MEDICATIONS:    general anesthesia TOPICAL ANESTHETIC: no  DESCRIPTION OF PROCEDURE:   After the risks benefits and alternatives of the procedure were thoroughly explained, informed consent was obtained.  The Pentax ERCP [id number]  endoscope was introduced through the mouth  and advanced to the second portion of the duodenum .metal stent was brought into view in the proper position and grabbed with the rat tooth forceps and removed and the scope and the stent and the forceps were removed in tandem and the stent was recovered and the scope was reinserted back into the duodenum. The adjustable balloon loaded with the JAG jag was then used to easily cannulate the CBD and proceeded with an occlusion cholangiogram with injection below the balloon which drained readily and did not reveal any obvious stricture or residual stones and there was some cystic duct filling. We proceeded with 2 balloon pull-throughs and 2 occlusion cholangiogram of the CBD without abnormality and then cannulated the cystic duct remnant with the wire and the balloon and proceeded with one of occlusion cholangiogram of the cystic duct remnant and balloon pull-through again without stones debris obstruction or stricture. There was adequate biliary drainage and we elected to stop the procedure at this juncture the wire balloon and scope were all removed and the patient tolerated the procedure well without obvious complication and no pancreatic duct injection or wire advancements throughout the proceedure  The scope was then  completely withdrawn from the patient and the procedure terminated.     COMPLICATIONS: none  ENDOSCOPIC IMPRESSION:1. Status post stent removal 2. No obvious stricture or residual stones in either CBD or cystic duct 3. No PD injections or wire advancements 4 no other findings  RECOMMENDATIONS:observe for delayed complication call me when necessary followup in one month if doing well and await cytology on stent     _______________________________ eSigned:  Vida Rigger, MD 11/22/2011 8:37 AM   CC:

## 2011-11-22 NOTE — Anesthesia Postprocedure Evaluation (Signed)
  Anesthesia Post-op Note  Patient: Jon Phillips  Procedure(s) Performed: Procedure(s) (LRB): ENDOSCOPIC RETROGRADE CHOLANGIOPANCREATOGRAPHY (ERCP) (N/A)  Patient Location: PACU  Anesthesia Type: General  Level of Consciousness: awake and alert   Airway and Oxygen Therapy: Patient Spontanous Breathing  Post-op Pain: mild  Post-op Assessment: Post-op Vital signs reviewed, Patient's Cardiovascular Status Stable, Respiratory Function Stable, Patent Airway and No signs of Nausea or vomiting  Post-op Vital Signs: stable  Complications: No apparent anesthesia complications

## 2011-11-23 ENCOUNTER — Encounter (HOSPITAL_COMMUNITY): Payer: Self-pay | Admitting: Gastroenterology

## 2012-03-05 ENCOUNTER — Encounter (HOSPITAL_COMMUNITY): Payer: Self-pay | Admitting: Emergency Medicine

## 2012-03-05 ENCOUNTER — Emergency Department (HOSPITAL_COMMUNITY)
Admission: EM | Admit: 2012-03-05 | Discharge: 2012-03-05 | Disposition: A | Discharge status: Home / Self Care | Payer: BC Managed Care – PPO | Attending: Emergency Medicine | Admitting: Emergency Medicine

## 2012-03-05 DIAGNOSIS — F430 Acute stress reaction: Secondary | ICD-10-CM | POA: Insufficient documentation

## 2012-03-05 DIAGNOSIS — Z8719 Personal history of other diseases of the digestive system: Secondary | ICD-10-CM | POA: Insufficient documentation

## 2012-03-05 DIAGNOSIS — F411 Generalized anxiety disorder: Secondary | ICD-10-CM | POA: Insufficient documentation

## 2012-03-05 DIAGNOSIS — R109 Unspecified abdominal pain: Secondary | ICD-10-CM

## 2012-03-05 DIAGNOSIS — R110 Nausea: Secondary | ICD-10-CM | POA: Insufficient documentation

## 2012-03-05 DIAGNOSIS — Z79899 Other long term (current) drug therapy: Secondary | ICD-10-CM | POA: Insufficient documentation

## 2012-03-05 DIAGNOSIS — R1011 Right upper quadrant pain: Secondary | ICD-10-CM | POA: Insufficient documentation

## 2012-03-05 DIAGNOSIS — Z9089 Acquired absence of other organs: Secondary | ICD-10-CM | POA: Insufficient documentation

## 2012-03-05 DIAGNOSIS — Z9889 Other specified postprocedural states: Secondary | ICD-10-CM | POA: Insufficient documentation

## 2012-03-05 DIAGNOSIS — I10 Essential (primary) hypertension: Secondary | ICD-10-CM | POA: Insufficient documentation

## 2012-03-05 LAB — CBC WITH DIFFERENTIAL/PLATELET
Eosinophils Relative: 1 % (ref 0–5)
HCT: 45 % (ref 39.0–52.0)
Hemoglobin: 16.3 g/dL (ref 13.0–17.0)
Lymphocytes Relative: 15 % (ref 12–46)
MCHC: 36.2 g/dL — ABNORMAL HIGH (ref 30.0–36.0)
MCV: 86.4 fL (ref 78.0–100.0)
Monocytes Absolute: 0.7 10*3/uL (ref 0.1–1.0)
Monocytes Relative: 6 % (ref 3–12)
Neutro Abs: 8.5 10*3/uL — ABNORMAL HIGH (ref 1.7–7.7)

## 2012-03-05 LAB — COMPREHENSIVE METABOLIC PANEL
ALT: 25 U/L (ref 0–53)
AST: 33 U/L (ref 0–37)
Albumin: 3.9 g/dL (ref 3.5–5.2)
Alkaline Phosphatase: 84 U/L (ref 39–117)
Chloride: 104 mEq/L (ref 96–112)
Potassium: 4.5 mEq/L (ref 3.5–5.1)
Sodium: 138 mEq/L (ref 135–145)
Total Bilirubin: 0.4 mg/dL (ref 0.3–1.2)

## 2012-03-05 LAB — URINALYSIS, MICROSCOPIC ONLY
Bilirubin Urine: NEGATIVE
Hgb urine dipstick: NEGATIVE
Ketones, ur: NEGATIVE mg/dL
Specific Gravity, Urine: 1.014 (ref 1.005–1.030)
pH: 5 (ref 5.0–8.0)

## 2012-03-05 MED ORDER — ONDANSETRON HCL 4 MG/2ML IJ SOLN
4.0000 mg | Freq: Once | INTRAMUSCULAR | Status: AC
  Administered 2012-03-05: 4 mg via INTRAVENOUS
  Filled 2012-03-05: qty 2

## 2012-03-05 MED ORDER — HYDROMORPHONE HCL PF 1 MG/ML IJ SOLN
1.0000 mg | Freq: Once | INTRAMUSCULAR | Status: AC
  Administered 2012-03-05: 1 mg via INTRAVENOUS
  Filled 2012-03-05: qty 1

## 2012-03-05 MED ORDER — SODIUM CHLORIDE 0.9 % IV SOLN
1000.0000 mL | Freq: Once | INTRAVENOUS | Status: AC
  Administered 2012-03-05: 1000 mL via INTRAVENOUS

## 2012-03-05 NOTE — ED Provider Notes (Signed)
History     CSN: [id number]  Arrival date & time 03/05/12  4098   First MD Initiated Contact with Patient 03/05/12 [phone]      Chief Complaint  Patient presents with  . Abdominal Pain    (Consider location/radiation/quality/duration/timing/severity/associated sxs/prior treatment) Patient is a 51 y.o. male presenting with abdominal pain. The history is provided by the patient.  Abdominal Pain The primary symptoms of the illness include abdominal pain and nausea. The primary symptoms of the illness do not include fever, shortness of breath or dysuria.  Symptoms associated with the illness do not include back pain.  pt states onset ruq this morning while at hospital w spouse who was having hysterectomy. Pt denies feeling unusually stressed or anxious. States felt fine/normal when awoke this am. ruq pain, dull, non radiating, now much improved from prior. No exacerbating or alleviating factors. No back pain. Nausea. No vomiting. No diarrhea or constipation. Felt hot/sweaty, no fever. Denies chills. States remote hx (20 yrs ago) of cholecystectomy, several months ago developed jaundice, had biliary stricture, ?cbd stone, and developed cholangitis. Had stent placed. Stent removed 8/13, with no recurrent problems since.     Past Medical History  Diagnosis Date  . Hypertension   . Bile duct stricture 2013    Past Surgical History  Procedure Date  . Cholecystectomy   . Back surgery   . Ercp 05/04/2011    Procedure: ENDOSCOPIC RETROGRADE CHOLANGIOPANCREATOGRAPHY (ERCP);  Surgeon: Petra Kuba, MD;  Location: [facility] ENDOSCOPY;  Service: Endoscopy;  Laterality: N/A;  fluoro  . Eus 06/28/2011    Procedure: LOWER ENDOSCOPIC ULTRASOUND (EUS);  Surgeon: Petra Kuba, MD;  Location: [facility] ENDOSCOPY;  Service: Endoscopy;  Laterality: N/A;  Nicole/Katy  Dr. Dulce Sellar to do EUS  . Ercp 06/28/2011    Procedure: ENDOSCOPIC RETROGRADE CHOLANGIOPANCREATOGRAPHY (ERCP);  Surgeon: Petra Kuba, MD;  Location: [facility]  ENDOSCOPY;  Service: Endoscopy;  Laterality: N/A;  Dr. Ewing Schlein to do ERCP  . Ercp 07/03/2011    Procedure: ENDOSCOPIC RETROGRADE CHOLANGIOPANCREATOGRAPHY (ERCP);  Surgeon: Graylin Shiver, MD;  Location: [facility] ENDOSCOPY;  Service: Endoscopy;  Laterality: N/A;  . Ercp 09/28/2011    Procedure: ENDOSCOPIC RETROGRADE CHOLANGIOPANCREATOGRAPHY (ERCP);  Surgeon: Petra Kuba, MD;  Location: [facility] ENDOSCOPY;  Service: Endoscopy;  Laterality: N/A;  Brushing/ stent change/spyglass case--tradd from [place] will be here  . Spyglass cholangioscopy 09/28/2011    Procedure: SPYGLASS CHOLANGIOSCOPY;  Surgeon: Petra Kuba, MD;  Location: [facility] ENDOSCOPY;  Service: Endoscopy;  Laterality: N/A;  . Eus 09/28/2011    Procedure: ESOPHAGEAL ENDOSCOPIC ULTRASOUND (EUS) RADIAL;  Surgeon: Petra Kuba, MD;  Location: [facility] ENDOSCOPY;  Service: Endoscopy;  Laterality: N/A;  . Fine needle aspiration 09/28/2011    Procedure: FINE NEEDLE ASPIRATION (FNA) LINEAR;  Surgeon: Petra Kuba, MD;  Location: [facility] ENDOSCOPY;  Service: Endoscopy;;  . Ercp 11/22/2011    Procedure: ENDOSCOPIC RETROGRADE CHOLANGIOPANCREATOGRAPHY (ERCP);  Surgeon: Petra Kuba, MD;  Location: [facility] ENDOSCOPY;  Service: Endoscopy;  Laterality: N/A;    Family History  Problem Relation Age of Onset  . Hypertension Mother   . Hypertension Father   . Malignant hyperthermia Neg Hx     History  Substance Use Topics  . Smoking status: Never Smoker   . Smokeless tobacco: Not on file     Comment: Occasional cigars only  . Alcohol Use: 1.2 oz/week    2 Cans of beer per week     Comment: weekends      Review of  Systems  Constitutional: Negative for fever.  HENT: Negative for neck pain.   Eyes: Negative for redness.  Respiratory: Negative for shortness of breath.   Cardiovascular: Negative for chest pain.  Gastrointestinal: Positive for nausea and abdominal pain.  Genitourinary: Negative for dysuria and flank pain.  Musculoskeletal: Negative for back pain.  Skin: Negative  for rash.  Neurological: Negative for headaches.  Hematological: Does not bruise/bleed easily.  Psychiatric/Behavioral: Negative for confusion.    Allergies  Review of patient's allergies indicates no known allergies.  Home Medications   Current Outpatient Rx  Name  Route  Sig  Dispense  Refill  . IBUPROFEN 600 MG PO TABS   Oral   Take 600 mg by mouth every 6 (six) hours as needed. Pain/fever         . LISINOPRIL 20 MG PO TABS   Oral   Take 20 mg by mouth daily.         Marland Kitchen PSEUDOEPHEDRINE HCL 30 MG PO TABS   Oral   Take 30 mg by mouth every 4 (four) hours as needed. Cold symptoms           BP 131/82  Pulse 60  Temp 97.4 F (36.3 C) (Oral)  Resp 16  SpO2 98%  Physical Exam  Nursing note and vitals reviewed. Constitutional: He is oriented to person, place, and time. He appears well-developed and well-nourished. No distress.  HENT:  Head: Atraumatic.  Eyes: Pupils are equal, round, and reactive to light.  Neck: Neck supple. No tracheal deviation present.  Cardiovascular: Normal rate, regular rhythm, normal heart sounds and intact distal pulses.   Pulmonary/Chest: Effort normal and breath sounds normal. No accessory muscle usage. No respiratory distress.  Abdominal: Soft. He exhibits no distension and no mass. There is tenderness. There is no rebound and no guarding.       ruq tenderness  Genitourinary:       No cva tenderness  Musculoskeletal: Normal range of motion.  Neurological: He is alert and oriented to person, place, and time.  Skin: Skin is warm and dry.  Psychiatric: He has a normal mood and affect.    ED Course  Procedures (including critical care time)   Labs Reviewed  CBC WITH DIFFERENTIAL  COMPREHENSIVE METABOLIC PANEL  LIPASE, BLOOD  URINALYSIS, MICROSCOPIC ONLY   Results for orders placed during the hospital encounter of 03/05/12  CBC WITH DIFFERENTIAL      Component Value Range   WBC 11.0 (*) 4.0 - 10.5 K/uL   RBC 5.21  4.22 - 5.81  MIL/uL   Hemoglobin 16.3  13.0 - 17.0 g/dL   HCT 16.1  09.6 - 04.5 %   MCV 86.4  78.0 - 100.0 fL   MCH 31.3  26.0 - 34.0 pg   MCHC 36.2 (*) 30.0 - 36.0 g/dL   RDW 40.9  81.1 - 91.4 %   Platelets 211  150 - 400 K/uL   Neutrophils Relative 77  43 - 77 %   Neutro Abs 8.5 (*) 1.7 - 7.7 K/uL   Lymphocytes Relative 15  12 - 46 %   Lymphs Abs 1.7  0.7 - 4.0 K/uL   Monocytes Relative 6  3 - 12 %   Monocytes Absolute 0.7  0.1 - 1.0 K/uL   Eosinophils Relative 1  0 - 5 %   Eosinophils Absolute 0.1  0.0 - 0.7 K/uL   Basophils Relative 0  0 - 1 %   Basophils Absolute 0.0  0.0 -  0.1 K/uL  URINALYSIS, MICROSCOPIC ONLY      Component Value Range   Color, Urine YELLOW  YELLOW   APPearance CLEAR  CLEAR   Specific Gravity, Urine 1.014  1.005 - 1.030   pH 5.0  5.0 - 8.0   Glucose, UA NEGATIVE  NEGATIVE mg/dL   Hgb urine dipstick NEGATIVE  NEGATIVE   Bilirubin Urine NEGATIVE  NEGATIVE   Ketones, ur NEGATIVE  NEGATIVE mg/dL   Protein, ur NEGATIVE  NEGATIVE mg/dL   Urobilinogen, UA 0.2  0.0 - 1.0 mg/dL   Nitrite NEGATIVE  NEGATIVE   Leukocytes, UA NEGATIVE  NEGATIVE   Bacteria, UA FEW (*) RARE   Squamous Epithelial / LPF FEW (*) RARE   Urine-Other MUCOUS PRESENT    COMPREHENSIVE METABOLIC PANEL      Component Value Range   Sodium 138  135 - 145 mEq/L   Potassium 4.5  3.5 - 5.1 mEq/L   Chloride 104  96 - 112 mEq/L   CO2 24  19 - 32 mEq/L   Glucose, Bld 92  70 - 99 mg/dL   BUN 8  6 - 23 mg/dL   Creatinine, Ser 1.30  0.50 - 1.35 mg/dL   Calcium 9.2  8.4 - 86.5 mg/dL   Total Protein 7.4  6.0 - 8.3 g/dL   Albumin 3.9  3.5 - 5.2 g/dL   AST 33  0 - 37 U/L   ALT 25  0 - 53 U/L   Alkaline Phosphatase 84  39 - 117 U/L   Total Bilirubin 0.4  0.3 - 1.2 mg/dL   GFR calc non Af Amer >90  >90 mL/min   GFR calc Af Amer >90  >90 mL/min  LIPASE, BLOOD      Component Value Range   Lipase 23  11 - 59 U/L       MDM  Iv ns. Dilaudid 1 mg iv. zofran iv.  Labs.  Reviewed nursing notes and prior  charts for additional history.    lfts and lipase normal.   Pain resolved.  abd soft nt.  Pt tolerating po fluids.  Repeat vitals, stable, no fevers.  Pt appears stable for d/c.  Given past hx, discussed return if symptoms recur, abd pain, vomiting, fevers, other concern.            Suzi Roots, MD 03/05/12 1245

## 2012-03-05 NOTE — ED Notes (Signed)
Requested a urine sample. 

## 2012-03-05 NOTE — ED Notes (Signed)
Paged iv team, awaiting their arrival

## 2012-03-05 NOTE — ED Notes (Signed)
MD at bedside. 

## 2012-03-05 NOTE — ED Notes (Signed)
Pt presenting to ed with c/o right side abdominal pain. Pt states [facility] with [facility] and became diaphoretic, and pale with acute right side abdominal pain. Pt denies nausea, vomiting and diarrhea at this time

## 2013-11-19 ENCOUNTER — Telehealth: Payer: Self-pay

## 2013-11-19 ENCOUNTER — Encounter: Payer: Self-pay | Admitting: Internal Medicine

## 2013-11-19 ENCOUNTER — Ambulatory Visit (INDEPENDENT_AMBULATORY_CARE_PROVIDER_SITE_OTHER): Payer: BC Managed Care – PPO | Admitting: Internal Medicine

## 2013-11-19 VITALS — BP 117/77 | HR 64 | Temp 98.1°F | Ht 75.0 in | Wt 254.0 lb

## 2013-11-19 DIAGNOSIS — I10 Essential (primary) hypertension: Secondary | ICD-10-CM

## 2013-11-19 DIAGNOSIS — K831 Obstruction of bile duct: Secondary | ICD-10-CM | POA: Insufficient documentation

## 2013-11-19 NOTE — Progress Notes (Signed)
Subjective:    Patient ID: Jon Phillips, male    DOB: Nov 17, 1960, 53 y.o.   MRN: [id number]  DOS:  11/19/2013 Type of visit - description: new pt, previous PCP Dr Junius Roads, moved to [place] History: In general feeling well, good compliance with lisinopril, no ambulatory BPs, BP today is very good. Last time he had labs was approximately 6 months ago during a physical. He has history or of biliary disease, asymptomatic for the last 2 years.   ROS Denies chest pain, difficulty breathing or lower extremity edema No nausea, vomiting, diarrhea No cough or sputum production   Past Medical History  Diagnosis Date  . Hypertension   . Bile duct stricture 2013    Past Surgical History  Procedure Laterality Date  . Cholecystectomy  1993  . Back surgery    . Ercp  05/04/2011    Procedure: ENDOSCOPIC RETROGRADE CHOLANGIOPANCREATOGRAPHY (ERCP);  Surgeon: Jeryl Columbia, MD;  Location: [facility] ENDOSCOPY;  Service: Endoscopy;  Laterality: N/A;  fluoro  . Eus  06/28/2011    Procedure: LOWER ENDOSCOPIC ULTRASOUND (EUS);  Surgeon: Jeryl Columbia, MD;  Location: [facility] ENDOSCOPY;  Service: Endoscopy;  Laterality: N/A;  Nicole/Katy    . Ercp  06/28/2011    Procedure: ENDOSCOPIC RETROGRADE CHOLANGIOPANCREATOGRAPHY (ERCP);  Surgeon: Jeryl Columbia, MD;  Location: [facility] ENDOSCOPY;  Service: Endoscopy;  Laterality: N/A;  Dr. Watt Climes to do ERCP  . Ercp  07/03/2011    Procedure: ENDOSCOPIC RETROGRADE CHOLANGIOPANCREATOGRAPHY (ERCP);  Surgeon: Wonda Horner, MD;  Location: [facility] ENDOSCOPY;  Service: Endoscopy;  Laterality: N/A;  . Ercp  09/28/2011    Procedure: ENDOSCOPIC RETROGRADE CHOLANGIOPANCREATOGRAPHY (ERCP);  Surgeon: Jeryl Columbia, MD;  Location: [facility] ENDOSCOPY;  Service: Endoscopy;  Laterality: N/A;  Brushing/ stent change/spyglass case--tradd from [place] will be here  . Spyglass cholangioscopy  09/28/2011    Procedure: SPYGLASS CHOLANGIOSCOPY;  Surgeon: Jeryl Columbia, MD;  Location: [facility] ENDOSCOPY;  Service: Endoscopy;  Laterality:  N/A;  . Eus  09/28/2011    Procedure: ESOPHAGEAL ENDOSCOPIC ULTRASOUND (EUS) RADIAL;  Surgeon: Jeryl Columbia, MD;  Location: [facility] ENDOSCOPY;  Service: Endoscopy;  Laterality: N/A;  . Fine needle aspiration  09/28/2011    Procedure: FINE NEEDLE ASPIRATION (FNA) LINEAR;  Surgeon: Jeryl Columbia, MD;  Location: [facility] ENDOSCOPY;  Service: Endoscopy;;  . Ercp  11/22/2011    Procedure: ENDOSCOPIC RETROGRADE CHOLANGIOPANCREATOGRAPHY (ERCP);  Surgeon: Jeryl Columbia, MD;  Location: [facility] ENDOSCOPY;  Service: Endoscopy;  Laterality: N/A;    History   Social History  . Marital Status: Married    Spouse Name: N/A    Number of Children: 2  . Years of Education: N/A   Occupational History  . Full time - Sales promotion account executive    Social History Main Topics  . Smoking status: Never Smoker   . Smokeless tobacco: Never Used     Comment: Occasional cigars only  . Alcohol Use: 1.2 oz/week    2 Cans of beer per week     Comment: weekends  . Drug Use: No  . Sexual Activity: Not on file   Other Topics Concern  . Not on file   Social History Narrative   son 59, daughter 10     Family History  Problem Relation Age of Onset  . Hypertension Mother   . Hypertension Father   . Malignant hyperthermia Neg Hx   . CAD Neg Hx   . Diabetes Neg Hx   . Colon cancer Neg Hx   .  Prostate cancer Neg Hx       Medication List       This list is accurate as of: 11/19/13  6:43 PM.  Always use your most recent med list.               lisinopril 20 MG tablet  Commonly known as:  PRINIVIL,ZESTRIL  Take 20 mg by mouth daily.           Objective:   Physical Exam BP 117/77  Pulse 64  Temp(Src) 98.1 F (36.7 C) (Oral)  Ht 6\' 3"  (1.905 m)  Wt 254 lb (115.214 kg)  BMI 31.75 kg/m2  SpO2 99%  General -- alert, well-developed, NAD.  Neck --no thyromegaly  HEENT-- Not pale. Or jaundice Lungs -- normal respiratory effort, no intercostal retractions, no accessory muscle use, and normal breath sounds.  Heart--  normal rate, regular rhythm, no murmur.  Abdomen-- Not distended, good bowel sounds,soft, non-tender. Extremities-- no pretibial edema bilaterally  Neurologic--  alert & oriented X3. Speech normal, gait appropriate for age, strength symmetric and appropriate for age.  Psych-- Cognition and judgment appear intact. Cooperative with normal attention span and concentration. No anxious or depressed appearing.       Assessment & Plan:   Reports a negative colposcopy in 2009

## 2013-11-19 NOTE — Progress Notes (Signed)
Pre-visit discussion using our clinic review tool. No additional management support is needed unless otherwise documented below in the visit note.  

## 2013-11-19 NOTE — Patient Instructions (Signed)
Get your blood work before you leave   Get records from your previous MD: Labs and office notes from the last 2 years XRs, EKGs, colonoscopies, immunization records  Need a ROI from the front desk  Next visit is for a physical exam in  6 months. fasting Please make an appointment

## 2013-11-19 NOTE — Assessment & Plan Note (Signed)
In 2013 developed jaundice, had several procedures including 2 biliary stents (removed eventually), was in the hospital with a severe infection, fortunately he has been asx x 2 year

## 2013-11-19 NOTE — Telephone Encounter (Signed)
error 

## 2013-11-19 NOTE — Assessment & Plan Note (Signed)
Good compliance with his, ambulatory BP satisfactorily, check a BMP, refill medications, come back in 6 months for a physical

## 2013-11-20 LAB — BASIC METABOLIC PANEL
BUN: 9 mg/dL (ref 6–23)
CHLORIDE: 104 meq/L (ref 96–112)
CO2: 29 mEq/L (ref 19–32)
Calcium: 9.1 mg/dL (ref 8.4–10.5)
Creatinine, Ser: 1 mg/dL (ref 0.4–1.5)
GFR: 81.65 mL/min (ref >60.00)
Glucose, Bld: 72 mg/dL (ref 70–99)
POTASSIUM: 3.9 meq/L (ref 3.5–5.1)
Sodium: 139 mEq/L (ref 135–145)

## 2013-11-25 ENCOUNTER — Encounter: Payer: Self-pay | Admitting: Internal Medicine

## 2013-11-25 ENCOUNTER — Other Ambulatory Visit: Payer: Self-pay | Admitting: Internal Medicine

## 2013-11-25 MED ORDER — LISINOPRIL 20 MG PO TABS: 20.0000 mg | ORAL_TABLET | Freq: Every day | ORAL | Status: DC

## 2013-12-26 ENCOUNTER — Encounter: Payer: Self-pay | Admitting: Internal Medicine

## 2013-12-26 ENCOUNTER — Other Ambulatory Visit: Payer: Self-pay | Admitting: Internal Medicine

## 2013-12-26 MED ORDER — VALACYCLOVIR HCL 1 G PO TABS: 1000.0000 mg | ORAL_TABLET | Freq: Two times a day (BID) | ORAL | Status: DC

## 2014-03-06 ENCOUNTER — Encounter: Payer: Self-pay | Admitting: Family Medicine

## 2014-03-06 ENCOUNTER — Ambulatory Visit (INDEPENDENT_AMBULATORY_CARE_PROVIDER_SITE_OTHER): Payer: BC Managed Care – PPO | Admitting: Family Medicine

## 2014-03-06 VITALS — BP 140/78 | HR 69 | Temp 97.9°F | Resp 16 | Wt 250.4 lb

## 2014-03-06 DIAGNOSIS — J0110 Acute frontal sinusitis, unspecified: Secondary | ICD-10-CM

## 2014-03-06 DIAGNOSIS — J321 Chronic frontal sinusitis: Secondary | ICD-10-CM | POA: Insufficient documentation

## 2014-03-06 MED ORDER — MECLIZINE HCL 50 MG PO TABS: 50.0000 mg | ORAL_TABLET | Freq: Three times a day (TID) | ORAL | Status: DC | PRN

## 2014-03-06 MED ORDER — AMOXICILLIN 875 MG PO TABS: 875.0000 mg | ORAL_TABLET | Freq: Two times a day (BID) | ORAL | Status: DC

## 2014-03-06 NOTE — Progress Notes (Signed)
   Subjective:    Patient ID: Jon Phillips, male    DOB: 08/24/1960, 53 y.o.   MRN: [id number]  HPI having 'lightheadedness', HA.  'i thought I had a cold' but no runny nose or chest congestion.  sxs started 4-5 days ago.  Pt was concerned about BP.  On Lisinopril.  Mild sinus pain/pressure.  No ear pain.  No N/V/D.  + sick contacts.  Dizziness is occuring w/ turning head- rolling over to turn alarm off- or rapid position changes.   Review of Systems For ROS see HPI     Objective:   Physical Exam  Constitutional: He appears well-developed and well-nourished. No distress.  HENT:  Head: Normocephalic and atraumatic.  Right Ear: Tympanic membrane normal.  Left Ear: Tympanic membrane normal.  Nose: Mucosal edema and rhinorrhea present. Right sinus exhibits frontal sinus tenderness. Right sinus exhibits no maxillary sinus tenderness. Left sinus exhibits frontal sinus tenderness. Left sinus exhibits no maxillary sinus tenderness.  Mouth/Throat: Mucous membranes are normal. Oropharyngeal exudate and posterior oropharyngeal erythema present. No posterior oropharyngeal edema.  + PND  Eyes: Conjunctivae and EOM are normal. Pupils are equal, round, and reactive to light.  Neck: Normal range of motion. Neck supple.  Cardiovascular: Normal rate, regular rhythm and normal heart sounds.   Pulmonary/Chest: Effort normal and breath sounds normal. No respiratory distress. He has no wheezes.  + hacking cough  Lymphadenopathy:    He has no cervical adenopathy.  Skin: Skin is warm and dry.  Vitals reviewed.         Assessment & Plan:

## 2014-03-06 NOTE — Assessment & Plan Note (Signed)
Pt's dizziness and HA along w/ TTP over frontal sinuses consistent w/ sinusitis causing labyrinthitis/BPV.  Start abx for infxn.  Meclizine prn dizziness.  .falgs  Pt expressed understanding and is in agreement w/ plan.

## 2014-03-06 NOTE — Progress Notes (Signed)
Pre visit review using our clinic review tool, if applicable. No additional management support is needed unless otherwise documented below in the visit note. 

## 2014-03-06 NOTE — Patient Instructions (Signed)
Follow up as needed Start the Amoxicillin twice daily- take w/ food Drink plenty of fluids Use the Meclizine only as needed for dizziness REST! Call with any questions or concerns Hang in there! Happy Holidays!!!

## 2014-04-29 ENCOUNTER — Other Ambulatory Visit: Payer: Self-pay | Admitting: Family Medicine

## 2014-04-29 ENCOUNTER — Encounter: Payer: Self-pay | Admitting: Family Medicine

## 2014-04-30 ENCOUNTER — Encounter: Payer: Self-pay | Admitting: General Practice

## 2014-04-30 NOTE — Telephone Encounter (Signed)
I am sorry to hear this, If it is a  Monetary issue [facility] does have a payment plan option. If not definitely see the UC or someone if symptoms persist.

## 2014-04-30 NOTE — Telephone Encounter (Signed)
Med denied, cannot refill without being seen. [place] on Abx.

## 2014-05-15 ENCOUNTER — Encounter: Payer: Self-pay | Admitting: Internal Medicine

## 2014-05-21 ENCOUNTER — Encounter: Payer: Self-pay | Admitting: Internal Medicine

## 2014-05-21 ENCOUNTER — Ambulatory Visit (INDEPENDENT_AMBULATORY_CARE_PROVIDER_SITE_OTHER): Payer: BLUE CROSS/BLUE SHIELD | Admitting: Internal Medicine

## 2014-05-21 VITALS — BP 132/78 | HR 75 | Temp 97.5°F | Ht 75.0 in | Wt 253.1 lb

## 2014-05-21 DIAGNOSIS — I10 Essential (primary) hypertension: Secondary | ICD-10-CM

## 2014-05-21 DIAGNOSIS — Z0000 Encounter for general adult medical examination without abnormal findings: Secondary | ICD-10-CM

## 2014-05-21 DIAGNOSIS — Z125 Encounter for screening for malignant neoplasm of prostate: Secondary | ICD-10-CM

## 2014-05-21 DIAGNOSIS — Z23 Encounter for immunization: Secondary | ICD-10-CM

## 2014-05-21 LAB — CBC WITH DIFFERENTIAL/PLATELET
BASOS ABS: 0 10*3/uL (ref 0.0–0.1)
Basophils Relative: 0.5 % (ref 0.0–3.0)
EOS ABS: 0.1 10*3/uL (ref 0.0–0.7)
Eosinophils Relative: 1.4 % (ref 0.0–5.0)
HEMATOCRIT: 45.8 % (ref 39.0–52.0)
HEMOGLOBIN: 16 g/dL (ref 13.0–17.0)
LYMPHS ABS: 2 10*3/uL (ref 0.7–4.0)
Lymphocytes Relative: 28.1 % (ref 12.0–46.0)
MCHC: 35 g/dL (ref 30.0–36.0)
MCV: 89.2 fl (ref 78.0–100.0)
MONOS PCT: 7.1 % (ref 3.0–12.0)
Monocytes Absolute: 0.5 10*3/uL (ref 0.1–1.0)
Neutro Abs: 4.6 10*3/uL (ref 1.4–7.7)
Neutrophils Relative %: 62.9 % (ref 43.0–77.0)
Platelets: 216 10*3/uL (ref 150.0–400.0)
RBC: 5.13 Mil/uL (ref 4.22–5.81)
RDW: 12.6 % (ref 11.5–15.5)
WBC: 7.3 10*3/uL (ref 4.0–10.5)

## 2014-05-21 LAB — COMPREHENSIVE METABOLIC PANEL
ALT: 19 U/L (ref 0–53)
AST: 20 U/L (ref 0–37)
Albumin: 4.3 g/dL (ref 3.5–5.2)
Alkaline Phosphatase: 72 U/L (ref 39–117)
BILIRUBIN TOTAL: 0.7 mg/dL (ref 0.2–1.2)
BUN: 12 mg/dL (ref 6–23)
CHLORIDE: 104 meq/L (ref 96–112)
CO2: 25 meq/L (ref 19–32)
Calcium: 9.7 mg/dL (ref 8.4–10.5)
Creatinine, Ser: 1.02 mg/dL (ref 0.40–1.50)
GFR: 80.58 mL/min (ref >60.00)
Glucose, Bld: 90 mg/dL (ref 70–99)
Potassium: 4.2 mEq/L (ref 3.5–5.1)
SODIUM: 138 meq/L (ref 135–145)
TOTAL PROTEIN: 7.4 g/dL (ref 6.0–8.3)

## 2014-05-21 LAB — LIPID PANEL
Cholesterol: 166 mg/dL (ref 0–200)
HDL: 43.8 mg/dL (ref >39.00)
LDL Cholesterol: 97 mg/dL (ref 0–99)
NONHDL: 122.2
TRIGLYCERIDES: 127 mg/dL (ref 0.0–149.0)
Total CHOL/HDL Ratio: 4
VLDL: 25.4 mg/dL (ref 0.0–40.0)

## 2014-05-21 LAB — PSA: PSA: 1.49 ng/mL (ref 0.10–4.00)

## 2014-05-21 LAB — TSH: TSH: 2.35 u[IU]/mL (ref 0.35–4.50)

## 2014-05-21 MED ORDER — PREDNISONE 10 MG PO TABS: ORAL_TABLET | ORAL | Status: DC

## 2014-05-21 MED ORDER — LISINOPRIL 20 MG PO TABS: 20.0000 mg | ORAL_TABLET | Freq: Every day | ORAL | Status: DC

## 2014-05-21 NOTE — Progress Notes (Signed)
Pre visit review using our clinic review tool, if applicable. No additional management support is needed unless otherwise documented below in the visit note. 

## 2014-05-21 NOTE — Assessment & Plan Note (Addendum)
Jon Phillips today Reports he had a colonoscopy at age 54, was told is normal, will request records from [place] and exercise discussed EKG sinus rhythm.  Other issues: Serous otitis, Flonase, short-term prednisone Hypertension, well controlled, checking a BMP today, as long as BP is well-controlled, follow-up in one year Right shoulder pain, likely a rotator cuff tendinitis. He is getting a short-term prednisone treatment, likely will help the shoulder as well, if he is not better he will be refer to sports medicine

## 2014-05-21 NOTE — Progress Notes (Signed)
Subjective:    Patient ID: Jon Phillips, male    DOB: [birth year], 54 y.o.   MRN: [id number]  DOS:  05/21/2014 Type of visit - description : cpx Interval history: Was seen in December with URI, prescribe amoxicillin, overall better but 4 weeks ago developed ill-defined discomfort in the left ear and dizziness when he turns in bed to the left, dizziness lasts "3 seconds". No ear discharge, nasal discharge, mild sinus pressure. Also 4-6 weeks history of right shoulder pain, started after he changed his shirt ( extended back her his arm), symptoms increase with arm movement. No injury, fall, neck pain or upper extremity paresthesias.  Review of Systems Constitutional: No fever, chills. No unexplained wt changes. No unusual sweats HEENT: No dental problems,  No eye discharge, redness or intolerance to light Respiratory: No wheezing or difficulty breathing. No cough , mucus production Cardiovascular: No CP, leg swelling or palpitations GI: no nausea, vomiting, diarrhea or abdominal pain.  No blood in the stools. No dysphagia   Endocrine: No polyphagia, polyuria or polydipsia GU: No dysuria, gross hematuria, difficulty urinating. No urinary urgency or frequency. Musculoskeletal: No joint swellings  Skin: No change in the color of the skin, palor or rash Allergic, immunologic: No environmental allergies or food allergies Neurological: No dizziness or syncope. No headaches. No diplopia, slurred speech, motor deficits, facial numbness Hematological: No enlarged lymph nodes, easy bruising or bleeding Psychiatry: No suicidal ideas, hallucinations, behavior problems or confusion. No unusual/severe anxiety or depression.     Past Medical History  Diagnosis Date  . Hypertension   . Bile duct stricture 2013    Past Surgical History  Procedure Laterality Date  . Cholecystectomy  1993  . Back surgery    . Ercp  05/04/2011    Procedure: ENDOSCOPIC RETROGRADE CHOLANGIOPANCREATOGRAPHY (ERCP);   Surgeon: Jeryl Columbia, MD;  Location: [facility] ENDOSCOPY;  Service: Endoscopy;  Laterality: N/A;  fluoro  . Eus  06/28/2011    Procedure: LOWER ENDOSCOPIC ULTRASOUND (EUS);  Surgeon: Jeryl Columbia, MD;  Location: [facility] ENDOSCOPY;  Service: Endoscopy;  Laterality: N/A;  Nicole/Katy    . Ercp  06/28/2011    Procedure: ENDOSCOPIC RETROGRADE CHOLANGIOPANCREATOGRAPHY (ERCP);  Surgeon: Jeryl Columbia, MD;  Location: [facility] ENDOSCOPY;  Service: Endoscopy;  Laterality: N/A;  Dr. Watt Climes to do ERCP  . Ercp  07/03/2011    Procedure: ENDOSCOPIC RETROGRADE CHOLANGIOPANCREATOGRAPHY (ERCP);  Surgeon: Wonda Horner, MD;  Location: [facility] ENDOSCOPY;  Service: Endoscopy;  Laterality: N/A;  . Ercp  09/28/2011    Procedure: ENDOSCOPIC RETROGRADE CHOLANGIOPANCREATOGRAPHY (ERCP);  Surgeon: Jeryl Columbia, MD;  Location: [facility] ENDOSCOPY;  Service: Endoscopy;  Laterality: N/A;  Brushing/ stent change/spyglass case--tradd from [place] will be here  . Spyglass cholangioscopy  09/28/2011    Procedure: SPYGLASS CHOLANGIOSCOPY;  Surgeon: Jeryl Columbia, MD;  Location: [facility] ENDOSCOPY;  Service: Endoscopy;  Laterality: N/A;  . Eus  09/28/2011    Procedure: ESOPHAGEAL ENDOSCOPIC ULTRASOUND (EUS) RADIAL;  Surgeon: Jeryl Columbia, MD;  Location: [facility] ENDOSCOPY;  Service: Endoscopy;  Laterality: N/A;  . Fine needle aspiration  09/28/2011    Procedure: FINE NEEDLE ASPIRATION (FNA) LINEAR;  Surgeon: Jeryl Columbia, MD;  Location: [facility] ENDOSCOPY;  Service: Endoscopy;;  . Ercp  11/22/2011    Procedure: ENDOSCOPIC RETROGRADE CHOLANGIOPANCREATOGRAPHY (ERCP);  Surgeon: Jeryl Columbia, MD;  Location: [facility] ENDOSCOPY;  Service: Endoscopy;  Laterality: N/A;    History   Social History  . Marital Status: Married    Spouse Name: N/A  .  Number of Children: 2  . Years of Education: N/A   Occupational History  . Full time - Sales promotion account executive    Social History Main Topics  . Smoking status: Never Smoker   . Smokeless tobacco: Never Used     Comment: Occasional cigars only  .  Alcohol Use: 1.2 oz/week    2 Cans of beer per week     Comment: weekends  . Drug Use: No  . Sexual Activity: Not on file   Other Topics Concern  . Not on file   Social History Narrative   son 33, daughter 62     Family History  Problem Relation Age of Onset  . Hypertension Mother   . Hypertension Father   . Malignant hyperthermia Neg Hx   . CAD Neg Hx   . Diabetes Neg Hx   . Colon cancer Neg Hx   . Prostate cancer Neg Hx       Medication List       This list is accurate as of: 05/21/14  5:18 PM.  Always use your most recent med list.               lisinopril 20 MG tablet  Commonly known as:  PRINIVIL,ZESTRIL  Take 1 tablet (20 mg total) by mouth daily.     meclizine 50 MG tablet  Commonly known as:  ANTIVERT  Take 1 tablet (50 mg total) by mouth 3 (three) times daily as needed.     predniSONE 10 MG tablet  Commonly known as:  DELTASONE  4 tablets x 2 days, 3 tabs x 2 days, 2 tabs x 2 days, 1 tab x 2 days     valACYclovir 1000 MG tablet  Commonly known as:  VALTREX  Take 1 tablet (1,000 mg total) by mouth 2 (two) times daily. Take a total of 2 tablets for each  episode of cold sores           Objective:   Physical Exam BP 132/78 mmHg  Pulse 75  Temp(Src) 97.5 F (36.4 C) (Oral)  Ht 6\' 3"  (1.905 m)  Wt 253 lb 2 oz (114.817 kg)  BMI 31.64 kg/m2  SpO2 98% General:   Well developed, well nourished . NAD.  Neck:  Full range of motion. Supple. No  thyromegaly , normal carotid pulse, no LAD. HEENT:  Normocephalic . Face symmetric, atraumatic  Ears: Right and L ear:  TMs bulge, no red or discharge Nose: not congested  Lungs:  CTA B Normal respiratory effort, no intercostal retractions, no accessory muscle use. Heart: RRR,  no murmur.  Abdomen:  Not distended, soft, non-tender. No rebound or rigidity. No mass,organomegaly Muscle skeletal: no pretibial edema bilaterally  Shoulders symmetric, no TTP. R shoulder pain elicited with right arm  elevation, rotating it back or extending it. Motor exam unremarkable Skin: Exposed areas without rash. Not pale. Not jaundice Rectal:  External abnormalities: none. Normal sphincter tone. No rectal masses or tenderness.  Stool brown  Prostate: Prostate gland firm and smooth, no enlargement, nodularity, tenderness, mass, asymmetry or induration. Neurologic:  alert & oriented X3.  Speech normal, gait appropriate for age and unassisted Strength symmetric and appropriate for age.  Psych--  Cognition and judgment appear intact.  Cooperative with normal attention span and concentration.  Behavior appropriate. No anxious or depressed appearing.       Assessment & Plan:

## 2014-05-21 NOTE — Patient Instructions (Signed)
Get your blood work before you leave   Flonase nasal OTC 2 sprays in each side of the nose daily until better Prednisone as prescribed  For pain okay to use Tylenol   Also ok to use sparingly IBUPROFEN (Advil or Motrin) 200 mg 2 tablets every 6 hours as needed for pain.  Always take it with food because may cause gastritis and ulcers.  If you notice nausea, stomach pain, change in the color of stools --->  Stop the medicine and let us know   Call if not better in 3 or 4 weeks.  Check the  blood pressure   monthly   Be sure your blood pressure is between 110/65 and  145/85.  if it is consistently higher or lower, let me know     Please come back to the office in 1 year  for a physical exam. Come back fasting

## 2014-06-04 ENCOUNTER — Telehealth: Payer: Self-pay

## 2014-06-04 NOTE — Telephone Encounter (Signed)
-----   Message from Colon Branch, MD sent at 06/03/2014  5:55 PM EST ----- Regarding: RE: Call [facility], need the report of the last colonoscopy Okay, please call the patient and get a release of information  ----- Message -----    From: Wilfrid Lund, CMA    Sent: 05/22/2014  11:01 AM      To: Colon Branch, MD Subject: RE: Call [facility], need the report of the la#  Pt will have to sign a release of information.   ----- Message -----    From: Colon Branch, MD    Sent: 05/21/2014   5:20 PM      To: Wilfrid Lund, CMA Subject: Call [facility], need the report of the last c#

## 2014-06-04 NOTE — Telephone Encounter (Signed)
Letter printed and mailed to Pt along with release of information form to be completed and either returned to [place] or sent to [facility] for Pt's latest cscope report.

## 2014-07-03 ENCOUNTER — Encounter: Payer: Self-pay | Admitting: Internal Medicine

## 2014-07-04 ENCOUNTER — Other Ambulatory Visit: Payer: Self-pay

## 2014-07-04 DIAGNOSIS — M25519 Pain in unspecified shoulder: Secondary | ICD-10-CM

## 2014-07-07 ENCOUNTER — Ambulatory Visit (INDEPENDENT_AMBULATORY_CARE_PROVIDER_SITE_OTHER): Payer: BLUE CROSS/BLUE SHIELD | Admitting: Family Medicine

## 2014-07-07 VITALS — BP 133/79 | HR 76 | Ht 75.0 in | Wt 250.0 lb

## 2014-07-07 DIAGNOSIS — M25511 Pain in right shoulder: Secondary | ICD-10-CM

## 2014-07-07 NOTE — Patient Instructions (Signed)
You have a frozen shoulder (adhesive capsulitis), a buildup of scar tissue that limits motion of the shoulder joint. I'm concerned this started with a rupture of the ligament that holds the biceps tendon in place though it's difficult to fully assess this with a frozen shoulder. Both start with conservative treatment however. Limit lifting and overhead activities as much as possible. Heat 15 minutes at a time 3-4 times a day may help with movement and stiffness. Ibuprofen 600mg  three times a day with food. Steroid injections in a series have been shown to help with pain and motion. Codman exercises (pendulum, wall walking or table slides, arm circles) - do 3 sets of 15 daily of each of these once or twice a day. Physical therapy for rotator cuff strengthening is a consideration once you are out of the painful phase Consult with physical therapy however to get a more extensive home exercise program. Follow up in 5-6 weeks.

## 2014-07-08 ENCOUNTER — Ambulatory Visit: Payer: BLUE CROSS/BLUE SHIELD | Admitting: Family Medicine

## 2014-07-10 DIAGNOSIS — M25511 Pain in right shoulder: Secondary | ICD-10-CM | POA: Insufficient documentation

## 2014-07-10 NOTE — Progress Notes (Signed)
PCP and referred by: Kathlene November, MD  Subjective:   HPI: Patient is a 54 y.o. male here for right shoulder pain.  Patient reports he's had worsening right shoulder pain for about 6 months. Recalls at that time reaching behind into the back seat and if felt like something in his shoulder anteriorly 'rolled over' Motion has worsened since then. No swelling or bruising. Pain can radiate down to the elbow. Difficulty getting dressed. No prior issues with this shoulder. Right handed. + night pain  Past Medical History  Diagnosis Date  . Hypertension   . Bile duct stricture 2013    Current Outpatient Prescriptions on File Prior to Visit  Medication Sig Dispense Refill  . lisinopril (PRINIVIL,ZESTRIL) 20 MG tablet Take 1 tablet (20 mg total) by mouth daily. 90 tablet 3  . meclizine (ANTIVERT) 50 MG tablet Take 1 tablet (50 mg total) by mouth 3 (three) times daily as needed. (Patient not taking: Reported on 05/21/2014) 30 tablet 0  . predniSONE (DELTASONE) 10 MG tablet 4 tablets x 2 days, 3 tabs x 2 days, 2 tabs x 2 days, 1 tab x 2 days 20 tablet 0  . valACYclovir (VALTREX) 1000 MG tablet Take 1 tablet (1,000 mg total) by mouth 2 (two) times daily. Take a total of 2 tablets for each  episode of cold sores (Patient not taking: Reported on 05/21/2014) 20 tablet 0   No current facility-administered medications on file prior to visit.    Past Surgical History  Procedure Laterality Date  . Cholecystectomy  1993  . Back surgery    . Ercp  05/04/2011    Procedure: ENDOSCOPIC RETROGRADE CHOLANGIOPANCREATOGRAPHY (ERCP);  Surgeon: Jeryl Columbia, MD;  Location: [facility] ENDOSCOPY;  Service: Endoscopy;  Laterality: N/A;  fluoro  . Eus  06/28/2011    Procedure: LOWER ENDOSCOPIC ULTRASOUND (EUS);  Surgeon: Jeryl Columbia, MD;  Location: [facility] ENDOSCOPY;  Service: Endoscopy;  Laterality: N/A;  Nicole/Katy    . Ercp  06/28/2011    Procedure: ENDOSCOPIC RETROGRADE CHOLANGIOPANCREATOGRAPHY (ERCP);  Surgeon: Jeryl Columbia, MD;  Location: [facility] ENDOSCOPY;  Service: Endoscopy;  Laterality: N/A;  Dr. Watt Climes to do ERCP  . Ercp  07/03/2011    Procedure: ENDOSCOPIC RETROGRADE CHOLANGIOPANCREATOGRAPHY (ERCP);  Surgeon: Wonda Horner, MD;  Location: [facility] ENDOSCOPY;  Service: Endoscopy;  Laterality: N/A;  . Ercp  09/28/2011    Procedure: ENDOSCOPIC RETROGRADE CHOLANGIOPANCREATOGRAPHY (ERCP);  Surgeon: Jeryl Columbia, MD;  Location: [facility] ENDOSCOPY;  Service: Endoscopy;  Laterality: N/A;  Brushing/ stent change/spyglass case--tradd from [place] will be here  . Spyglass cholangioscopy  09/28/2011    Procedure: SPYGLASS CHOLANGIOSCOPY;  Surgeon: Jeryl Columbia, MD;  Location: [facility] ENDOSCOPY;  Service: Endoscopy;  Laterality: N/A;  . Eus  09/28/2011    Procedure: ESOPHAGEAL ENDOSCOPIC ULTRASOUND (EUS) RADIAL;  Surgeon: Jeryl Columbia, MD;  Location: [facility] ENDOSCOPY;  Service: Endoscopy;  Laterality: N/A;  . Fine needle aspiration  09/28/2011    Procedure: FINE NEEDLE ASPIRATION (FNA) LINEAR;  Surgeon: Jeryl Columbia, MD;  Location: [facility] ENDOSCOPY;  Service: Endoscopy;;  . Ercp  11/22/2011    Procedure: ENDOSCOPIC RETROGRADE CHOLANGIOPANCREATOGRAPHY (ERCP);  Surgeon: Jeryl Columbia, MD;  Location: [facility] ENDOSCOPY;  Service: Endoscopy;  Laterality: N/A;    No Known Allergies  History   Social History  . Marital Status: Married    Spouse Name: N/A  . Number of Children: 2  . Years of Education: N/A   Occupational History  . Full time -  Operations manager    Social History Main Topics  . Smoking status: Never Smoker   . Smokeless tobacco: Never Used     Comment: Occasional cigars only  . Alcohol Use: 1.2 oz/week    2 Cans of beer per week     Comment: weekends  . Drug Use: No  . Sexual Activity: Not on file   Other Topics Concern  . Not on file   Social History Narrative   son 38, daughter 2    Family History  Problem Relation Age of Onset  . Hypertension Mother   . Hypertension Father   . Malignant hyperthermia Neg Hx   . CAD  Neg Hx   . Diabetes Neg Hx   . Colon cancer Neg Hx   . Prostate cancer Neg Hx     BP 133/79 mmHg  Pulse 76  Ht 6\' 3"  (1.905 m)  Wt 250 lb (113.399 kg)  BMI 31.25 kg/m2  Review of Systems: See HPI above.    Objective:  Physical Exam:  Gen: NAD  Right shoulder: No swelling, ecchymoses.  No gross deformity. No TTP AC joint, biceps tendon currently. Motion limited to 50 degrees ER, 90 abduction, 100 flexion - cannot push passively beyond this. Negative Hawkins, Neers. Negative Speeds, Yergasons. Pain mild with resisted elbow flexion. Strength 5/5 with empty can and resisted internal/external rotation. NV intact distally.    Assessment & Plan:  1. Right shoulder pain - believe this may have started with tear of transverse bicipital ligament and led to him now having adhesive capsulitis.  Difficult to assess for the initial issue given his lack of motion though would start with conservative treatment for this anyway.  He would like to start with home exercises which were shown today, heat, ibuprofen.  Declined cortisone injection for now.  He also wants to consult with physical therapy to discuss some additional home exercises he could do.  F/u in 5-6 weeks.

## 2014-07-10 NOTE — Assessment & Plan Note (Signed)
believe this may have started with tear of transverse bicipital ligament and led to him now having adhesive capsulitis.  Difficult to assess for the initial issue given his lack of motion though would start with conservative treatment for this anyway.  He would like to start with home exercises which were shown today, heat, ibuprofen.  Declined cortisone injection for now.  He also wants to consult with physical therapy to discuss some additional home exercises he could do.  F/u in 5-6 weeks.

## 2014-07-15 ENCOUNTER — Encounter: Payer: Self-pay | Admitting: Family Medicine

## 2014-07-22 ENCOUNTER — Ambulatory Visit: Payer: BLUE CROSS/BLUE SHIELD | Attending: Family Medicine | Admitting: Physical Therapy

## 2014-07-22 ENCOUNTER — Encounter: Payer: Self-pay | Admitting: Physical Therapy

## 2014-07-22 DIAGNOSIS — M25511 Pain in right shoulder: Secondary | ICD-10-CM | POA: Insufficient documentation

## 2014-07-22 DIAGNOSIS — M25611 Stiffness of right shoulder, not elsewhere classified: Secondary | ICD-10-CM | POA: Diagnosis not present

## 2014-07-22 NOTE — Therapy (Signed)
[facility] [address]  [place], [place], [address] Phone: [phone]   Fax:  [phone]  Physical Therapy Evaluation  Patient Details  Name: Jon Phillips MRN: [id number] Date of Birth: 1961/01/15 Referring Provider:  Dene Gentry, MD  Encounter Date: 07/22/2014      PT End of Session - 07/22/14 1227    Visit Number 1   Number of Visits 6   Date for PT Re-Evaluation 09/02/14   PT Start Time 1030   PT Stop Time 1118   PT Time Calculation (min) 48 min      Past Medical History  Diagnosis Date  . Hypertension   . Bile duct stricture 2013    Past Surgical History  Procedure Laterality Date  . Cholecystectomy  1993  . Back surgery    . Ercp  05/04/2011    Procedure: ENDOSCOPIC RETROGRADE CHOLANGIOPANCREATOGRAPHY (ERCP);  Surgeon: Jeryl Columbia, MD;  Location: [facility] ENDOSCOPY;  Service: Endoscopy;  Laterality: N/A;  fluoro  . Eus  06/28/2011    Procedure: LOWER ENDOSCOPIC ULTRASOUND (EUS);  Surgeon: Jeryl Columbia, MD;  Location: [facility] ENDOSCOPY;  Service: Endoscopy;  Laterality: N/A;  Nicole/Katy    . Ercp  06/28/2011    Procedure: ENDOSCOPIC RETROGRADE CHOLANGIOPANCREATOGRAPHY (ERCP);  Surgeon: Jeryl Columbia, MD;  Location: [facility] ENDOSCOPY;  Service: Endoscopy;  Laterality: N/A;  Dr. Watt Climes to do ERCP  . Ercp  07/03/2011    Procedure: ENDOSCOPIC RETROGRADE CHOLANGIOPANCREATOGRAPHY (ERCP);  Surgeon: Wonda Horner, MD;  Location: [facility] ENDOSCOPY;  Service: Endoscopy;  Laterality: N/A;  . Ercp  09/28/2011    Procedure: ENDOSCOPIC RETROGRADE CHOLANGIOPANCREATOGRAPHY (ERCP);  Surgeon: Jeryl Columbia, MD;  Location: [facility] ENDOSCOPY;  Service: Endoscopy;  Laterality: N/A;  Brushing/ stent change/spyglass case--tradd from [place] will be here  . Spyglass cholangioscopy  09/28/2011    Procedure: SPYGLASS CHOLANGIOSCOPY;  Surgeon: Jeryl Columbia, MD;  Location: [facility] ENDOSCOPY;  Service: Endoscopy;  Laterality: N/A;  . Eus  09/28/2011   Procedure: ESOPHAGEAL ENDOSCOPIC ULTRASOUND (EUS) RADIAL;  Surgeon: Jeryl Columbia, MD;  Location: [facility] ENDOSCOPY;  Service: Endoscopy;  Laterality: N/A;  . Fine needle aspiration  09/28/2011    Procedure: FINE NEEDLE ASPIRATION (FNA) LINEAR;  Surgeon: Jeryl Columbia, MD;  Location: [facility] ENDOSCOPY;  Service: Endoscopy;;  . Ercp  11/22/2011    Procedure: ENDOSCOPIC RETROGRADE CHOLANGIOPANCREATOGRAPHY (ERCP);  Surgeon: Jeryl Columbia, MD;  Location: [facility] ENDOSCOPY;  Service: Endoscopy;  Laterality: N/A;    There were no vitals filed for this visit.  Visit Diagnosis:  Pain in joint, shoulder region, right - Plan: PT plan of care cert/re-cert  Shoulder stiffness, right - Plan: PT plan of care cert/re-cert      Subjective Assessment - 07/22/14 1033    Subjective Pt sent to [facility] due to R shoulder pain over the past several months.  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  Currently, pt c/o anterior R shoulder pain with lying supine with R UE by side and with L side-lying when R UE crosses body.  States no significant pain with activities that do not require reaching behind or overhead.  Is unable to perform overhead activities due to shoulder stiffness.  Initial pain noted approx 6 months ago while hitting tennis ball with tennis racket and noted anterior shoulder pain and popping pain.  Then noted again about 2 months later while reaching into back seat of car.   Currently in  Pain? Yes   Pain Score --  3-4/10   Pain Location Shoulder   Pain Orientation Right;Anterior   Pain Descriptors / Indicators Aching;Dull;Sharp  dull/ache at rest, sharp with reaching behind   Aggravating Factors  reaching   Pain Relieving Factors maybe hot shower but no sure if anything helps yet   Multiple Pain Sites No            [facility] PT Assessment - 07/22/14 0001    Assessment   Medical Diagnosis R shoulder pain   Onset Date 02/02/14   Balance Screen   Has the patient fallen in the  past 6 months No   Has the patient had a decrease in activity level because of a fear of falling?  No   Is the patient reluctant to leave their home because of a fear of falling?  No   Prior Function   Vocation Full time employment   Vocation Requirements mostly seated desk work   Leisure high school baseball, football, and basketball referee; Haematologist, golf   Observation/Other Assessments   Focus on Therapeutic Outcomes (FOTO)  43% limitation   ROM / Strength   AROM / PROM / Strength AROM;PROM   AROM   AROM Assessment Site Shoulder   Right/Left Shoulder Right   Right Shoulder Flexion 100 Degrees   Right Shoulder ABduction 75 Degrees   Right Shoulder Internal Rotation --  L reach to T10, R to R buttock   Right Shoulder External Rotation --  L reach to T4, R to back of head   PROM   PROM Assessment Site Shoulder   Right/Left Shoulder Right   Right Shoulder Flexion 110 Degrees   Right Shoulder ABduction 75 Degrees   Right Shoulder Internal Rotation 35 Degrees  at 60 ABD   Right Shoulder External Rotation 20 Degrees  at 60 ABD         TODAY'S TREATMENT: Manual - R GH caudal glides grade 3 to 4, R GH AP glide with ER mobes grade 3, contract/relax into ER TherEx - HEP instruct and perform with Yellow TB - see HEP                  PT Education - 07/22/14 1226    Education provided Yes   Education Details HEP per handout, HEP for wife to assist with ER stretch to R shoulder   Person(s) Educated Patient   Methods Explanation;Demonstration;Handout   Comprehension Returned demonstration;Verbalized understanding          PT Short Term Goals - 07/22/14 1232    PT SHORT TERM GOAL #1   Title pt independent with initial HEP by 08/01/14   Status New           PT Long Term Goals - 07/22/14 1233    PT LONG TERM GOAL #1   Title R Shoulder AROM WFL all planes by 09/02/14   Status New   PT LONG TERM GOAL #2   Title pt able to perform all chores, work duties,  and recreational activities without limit by shoulder LOM or pain by 09/02/14   Status New   PT LONG TERM GOAL #3   Title pt independent with advanced HEP as necessary for continued progress by 09/02/14   Status New   PT LONG TERM GOAL #4   Title pt states is able to sleep without limit by pain by 09/02/14   Status New  Plan - 07/22/14 1227    Clinical Impression Statement pt with R frozen shoulder and underlying prox bicep / transverse bicipital lig injury. R Shoulder with capsular pattern tightness so this will be initial focus of POC.  Will address strength/function, and bicep issues more as AROM become WFL.   Pt will benefit from skilled therapeutic intervention in order to improve on the following deficits Decreased mobility;Pain;Decreased range of motion;Impaired flexibility;Improper body mechanics;Decreased strength   Rehab Potential Good   PT Frequency 1x / week   PT Duration 6 weeks   PT Treatment/Interventions Manual techniques;Dry needling;Therapeutic exercise;Therapeutic activities   PT Next Visit Plan Treatments will focus on manual therapy and HEP progressions as ROM improves.  Patient a large out of pocket expense due to high deductible insurance.  Will limit visits to 1-2 units to limit his cost.   Consulted and Agree with Plan of Care Patient         Problem List Patient Active Problem List   Diagnosis Date Noted  . Right shoulder pain 07/10/2014  . Annual physical exam 05/21/2014  . Frontal sinusitis 03/06/2014  . Bile duct stricture   . Acute cholangitis 07/02/2011  . Hypertension 07/02/2011  . CHEST PAIN UNSPECIFIED 11/19/2009    Audryana Hockenberry PT, OCS 07/22/2014, 12:42 PM  [facility] [address]  [place], [place], [address] Phone: [phone]   Fax:  [phone]

## 2014-07-30 ENCOUNTER — Ambulatory Visit: Payer: BLUE CROSS/BLUE SHIELD | Admitting: Physical Therapy

## 2014-07-30 DIAGNOSIS — M25511 Pain in right shoulder: Secondary | ICD-10-CM | POA: Diagnosis not present

## 2014-07-30 DIAGNOSIS — M25611 Stiffness of right shoulder, not elsewhere classified: Secondary | ICD-10-CM

## 2014-07-30 NOTE — Therapy (Signed)
[facility] [address]  [place], [place], [address] Phone: [phone]   Fax:  [phone]  Physical Therapy Treatment  Patient Details  Name: Jon Phillips MRN: [id number] Date of Birth: [birth year] Referring Provider:  Dene Gentry, MD  Encounter Date: 07/30/2014      PT End of Session - 07/30/14 0754    Visit Number 2   Number of Visits 6   Date for PT Re-Evaluation 09/02/14   PT Start Time 0716   PT Stop Time 0750   PT Time Calculation (min) 34 min      Past Medical History  Diagnosis Date  . Hypertension   . Bile duct stricture 2013    Past Surgical History  Procedure Laterality Date  . Cholecystectomy  1993  . Back surgery    . Ercp  05/04/2011    Procedure: ENDOSCOPIC RETROGRADE CHOLANGIOPANCREATOGRAPHY (ERCP);  Surgeon: Jeryl Columbia, MD;  Location: [facility] ENDOSCOPY;  Service: Endoscopy;  Laterality: N/A;  fluoro  . Eus  06/28/2011    Procedure: LOWER ENDOSCOPIC ULTRASOUND (EUS);  Surgeon: Jeryl Columbia, MD;  Location: [facility] ENDOSCOPY;  Service: Endoscopy;  Laterality: N/A;  Nicole/Katy    . Ercp  06/28/2011    Procedure: ENDOSCOPIC RETROGRADE CHOLANGIOPANCREATOGRAPHY (ERCP);  Surgeon: Jeryl Columbia, MD;  Location: [facility] ENDOSCOPY;  Service: Endoscopy;  Laterality: N/A;  Dr. Watt Climes to do ERCP  . Ercp  07/03/2011    Procedure: ENDOSCOPIC RETROGRADE CHOLANGIOPANCREATOGRAPHY (ERCP);  Surgeon: Wonda Horner, MD;  Location: [facility] ENDOSCOPY;  Service: Endoscopy;  Laterality: N/A;  . Ercp  09/28/2011    Procedure: ENDOSCOPIC RETROGRADE CHOLANGIOPANCREATOGRAPHY (ERCP);  Surgeon: Jeryl Columbia, MD;  Location: [facility] ENDOSCOPY;  Service: Endoscopy;  Laterality: N/A;  Brushing/ stent change/spyglass case--tradd from [place] will be here  . Spyglass cholangioscopy  09/28/2011    Procedure: SPYGLASS CHOLANGIOSCOPY;  Surgeon: Jeryl Columbia, MD;  Location: [facility] ENDOSCOPY;  Service: Endoscopy;  Laterality: N/A;  . Eus  09/28/2011     Procedure: ESOPHAGEAL ENDOSCOPIC ULTRASOUND (EUS) RADIAL;  Surgeon: Jeryl Columbia, MD;  Location: [facility] ENDOSCOPY;  Service: Endoscopy;  Laterality: N/A;  . Fine needle aspiration  09/28/2011    Procedure: FINE NEEDLE ASPIRATION (FNA) LINEAR;  Surgeon: Jeryl Columbia, MD;  Location: [facility] ENDOSCOPY;  Service: Endoscopy;;  . Ercp  11/22/2011    Procedure: ENDOSCOPIC RETROGRADE CHOLANGIOPANCREATOGRAPHY (ERCP);  Surgeon: Jeryl Columbia, MD;  Location: [facility] ENDOSCOPY;  Service: Endoscopy;  Laterality: N/A;    There were no vitals filed for this visit.  Visit Diagnosis:  Shoulder stiffness, right  Pain in joint, shoulder region, right      Subjective Assessment - 07/30/14 0739    Subjective pt states has been performing HEP regularly with wife's assistance 2x/day and independently 4x/day.  States is often pain-free but severe pain with HEP stretches and             [facility] PT Assessment - 07/30/14 0001    AROM   AROM Assessment Site --   Right/Left Shoulder --   PROM   PROM Assessment Site Shoulder   Right/Left Shoulder Right   Right Shoulder Flexion 128 Degrees   Right Shoulder ABduction 88 Degrees   Right Shoulder Internal Rotation 50 Degrees  at 60 ABD   Right Shoulder External Rotation 48 Degrees  at 60 ABD       TODAY'S TREATMENT: Manual - Grade 4 AP and Caudal glides with mobes into ABD,  Flexion, ER, and IR.  Contract/Relax into ER and Flexion.  TherEx - UBE lvl 1/0 1'/1' Supine CW/CCW 5# 15x each, HEP update instruction 4 strips Kinesiotape R shoulder (Trial)                      PT Education - 07/30/14 0754    Education provided Yes   Education Details HEP update   Person(s) Educated Patient   Methods Explanation;Demonstration;Handout   Comprehension Returned demonstration;Verbalized understanding          PT Short Term Goals - 07/30/14 0757    PT SHORT TERM GOAL #1   Title pt independent with initial HEP by 08/01/14   Status Achieved            PT Long Term Goals - 07/30/14 0757    PT LONG TERM GOAL #1   Title R Shoulder AROM WFL all planes by 09/02/14   Status On-going   PT LONG TERM GOAL #2   Title pt able to perform all chores, work duties, and recreational activities without limit by shoulder LOM or pain by 09/02/14   Status On-going   PT LONG TERM GOAL #3   Title pt independent with advanced HEP as necessary for continued progress by 09/02/14   Status On-going   PT LONG TERM GOAL #4   Title pt states is able to sleep without limit by pain by 09/02/14  states is already sleeping better   Status Partially Met               Plan - 07/30/14 0755    Clinical Impression Statement Good improvement in R Shoulder PROM, excellent compliance with HEP, still severe short duration pain with manual here and at home with wife but states returns to pain-free once completed.  Mild progression to HEP today to help improve function/stability otherwise is to continue with stretching.   PT Next Visit Plan Treatments will focus on manual therapy and HEP progressions as ROM improves.  Patient a large out of pocket expense due to high deductible insurance.  Will limit visits to 1-2 units to limit his cost.   Consulted and Agree with Plan of Care Patient        Problem List Patient Active Problem List   Diagnosis Date Noted  . Right shoulder pain 07/10/2014  . Annual physical exam 05/21/2014  . Frontal sinusitis 03/06/2014  . Bile duct stricture   . Acute cholangitis 07/02/2011  . Hypertension 07/02/2011  . CHEST PAIN UNSPECIFIED 11/19/2009    Cali Cuartas PT, OCS 07/30/2014, 7:58 AM  [facility] [place], [place], [address] Phone: [phone]   Fax:  [phone]

## 2014-08-12 ENCOUNTER — Ambulatory Visit: Payer: BLUE CROSS/BLUE SHIELD | Attending: Family Medicine | Admitting: Physical Therapy

## 2014-08-12 DIAGNOSIS — M25611 Stiffness of right shoulder, not elsewhere classified: Secondary | ICD-10-CM

## 2014-08-12 DIAGNOSIS — M25511 Pain in right shoulder: Secondary | ICD-10-CM | POA: Diagnosis present

## 2014-08-12 NOTE — Therapy (Signed)
[facility] [address]  [place], [place], [address] Phone: [phone]   Fax:  [phone]  Physical Therapy Treatment  Patient Details  Name: Jon Phillips MRN: [id number] Date of Birth: 1960-05-03 Referring Provider:  Dene Gentry, MD  Encounter Date: 08/12/2014      PT End of Session - 08/12/14 1758    Visit Number 3   Number of Visits 6   Date for PT Re-Evaluation 09/02/14   PT Start Time 2951   PT Stop Time 1745   PT Time Calculation (min) 35 min      Past Medical History  Diagnosis Date  . Hypertension   . Bile duct stricture 2013    Past Surgical History  Procedure Laterality Date  . Cholecystectomy  1993  . Back surgery    . Ercp  05/04/2011    Procedure: ENDOSCOPIC RETROGRADE CHOLANGIOPANCREATOGRAPHY (ERCP);  Surgeon: Jeryl Columbia, MD;  Location: [facility] ENDOSCOPY;  Service: Endoscopy;  Laterality: N/A;  fluoro  . Eus  06/28/2011    Procedure: LOWER ENDOSCOPIC ULTRASOUND (EUS);  Surgeon: Jeryl Columbia, MD;  Location: [facility] ENDOSCOPY;  Service: Endoscopy;  Laterality: N/A;  Nicole/Katy    . Ercp  06/28/2011    Procedure: ENDOSCOPIC RETROGRADE CHOLANGIOPANCREATOGRAPHY (ERCP);  Surgeon: Jeryl Columbia, MD;  Location: [facility] ENDOSCOPY;  Service: Endoscopy;  Laterality: N/A;  Dr. Watt Climes to do ERCP  . Ercp  07/03/2011    Procedure: ENDOSCOPIC RETROGRADE CHOLANGIOPANCREATOGRAPHY (ERCP);  Surgeon: Wonda Horner, MD;  Location: [facility] ENDOSCOPY;  Service: Endoscopy;  Laterality: N/A;  . Ercp  09/28/2011    Procedure: ENDOSCOPIC RETROGRADE CHOLANGIOPANCREATOGRAPHY (ERCP);  Surgeon: Jeryl Columbia, MD;  Location: [facility] ENDOSCOPY;  Service: Endoscopy;  Laterality: N/A;  Brushing/ stent change/spyglass case--tradd from [place] will be here  . Spyglass cholangioscopy  09/28/2011    Procedure: SPYGLASS CHOLANGIOSCOPY;  Surgeon: Jeryl Columbia, MD;  Location: [facility] ENDOSCOPY;  Service: Endoscopy;  Laterality: N/A;  . Eus  09/28/2011   Procedure: ESOPHAGEAL ENDOSCOPIC ULTRASOUND (EUS) RADIAL;  Surgeon: Jeryl Columbia, MD;  Location: [facility] ENDOSCOPY;  Service: Endoscopy;  Laterality: N/A;  . Fine needle aspiration  09/28/2011    Procedure: FINE NEEDLE ASPIRATION (FNA) LINEAR;  Surgeon: Jeryl Columbia, MD;  Location: [facility] ENDOSCOPY;  Service: Endoscopy;;  . Ercp  11/22/2011    Procedure: ENDOSCOPIC RETROGRADE CHOLANGIOPANCREATOGRAPHY (ERCP);  Surgeon: Jeryl Columbia, MD;  Location: [facility] ENDOSCOPY;  Service: Endoscopy;  Laterality: N/A;    There were no vitals filed for this visit.  Visit Diagnosis:  Shoulder stiffness, right  Pain in joint, shoulder region, right      Subjective Assessment - 08/12/14 1703    Subjective States shoulder seems to get better some days and worse some days.  States seems to just ache all the time lately.  Thinks he may have over done it with yardwork and HEP over the weekend.  States HEP is going well and he feels as though is able to do more but is still limited.   Currently in Pain? Yes   Pain Score --  2-3/10 Ache, 4-5/10 with activity at worst   Pain Location Shoulder   Pain Orientation Right;Anterior            [facility] PT Assessment - 08/12/14 0001    PROM   Right Shoulder Flexion 130 Degrees   Right Shoulder ABduction 93 Degrees   Right Shoulder Internal Rotation 45 Degrees   Right Shoulder  External Rotation 58 Degrees         TODAY'S TREATMENT: Manual - Grade 4 AP and Caudal glides with mobes into ABD, Flexion, ER, and IR. Contract/Relax into ER and Flexion.  TPR   TherEx - R Shoulder ABD, Flexion, and D2 Flexion Diagonal hang stretch with 2# db          PT Short Term Goals - 07/30/14 0757    PT SHORT TERM GOAL #1   Title pt independent with initial HEP by 08/01/14   Status Achieved           PT Long Term Goals - 07/30/14 0757    PT LONG TERM GOAL #1   Title R Shoulder AROM WFL all planes by 09/02/14   Status On-going   PT LONG TERM GOAL #2   Title pt able to perform  all chores, work duties, and recreational activities without limit by shoulder LOM or pain by 09/02/14   Status On-going   PT LONG TERM GOAL #3   Title pt independent with advanced HEP as necessary for continued progress by 09/02/14   Status On-going   PT LONG TERM GOAL #4   Title pt states is able to sleep without limit by pain by 09/02/14  states is already sleeping better   Status Partially Met               Plan - 08/12/14 1759    Clinical Impression Statement Mild improvements in Flexion, ABD, and IR but slight loss in ER since last treatment (but still 10 degrees better than at initial eval).  Reviewed additional HEP options today.  Overall PROM values are improving and he is consistent with HEP but progress is slow.  He has f/u with MD scheduled for tomorrow but he states he wants to push this back 4-6 weeks while he continues with PT.   PT Next Visit Plan Treatments will focus on manual therapy and HEP progressions as ROM improves.  Patient a large out of pocket expense due to high deductible insurance.  Will limit visits to 1-2 units to limit his cost.   Consulted and Agree with Plan of Care Patient        Problem List Patient Active Problem List   Diagnosis Date Noted  . Right shoulder pain 07/10/2014  . Annual physical exam 05/21/2014  . Frontal sinusitis 03/06/2014  . Bile duct stricture   . Acute cholangitis 07/02/2011  . Hypertension 07/02/2011  . CHEST PAIN UNSPECIFIED 11/19/2009    Annaleigh Steinmeyer PT, OCS 08/12/2014, 6:02 PM  [facility] [address]  [place], [place], [address] Phone: [phone]   Fax:  [phone]

## 2014-08-13 ENCOUNTER — Ambulatory Visit: Payer: BLUE CROSS/BLUE SHIELD | Admitting: Family Medicine

## 2014-08-13 ENCOUNTER — Encounter: Payer: Self-pay | Admitting: Physical Therapy

## 2014-08-19 ENCOUNTER — Ambulatory Visit: Payer: BLUE CROSS/BLUE SHIELD | Admitting: Physical Therapy

## 2014-08-19 DIAGNOSIS — M25511 Pain in right shoulder: Secondary | ICD-10-CM | POA: Diagnosis not present

## 2014-08-19 DIAGNOSIS — M25611 Stiffness of right shoulder, not elsewhere classified: Secondary | ICD-10-CM

## 2014-08-19 NOTE — Therapy (Signed)
[facility] [address]  [place], [place], [address] Phone: [phone]   Fax:  [phone]  Physical Therapy Treatment  Patient Details  Name: Jon Phillips MRN: [id number] Date of Birth: [birth year] Referring Provider:  Dene Gentry, MD  Encounter Date: 08/19/2014      PT End of Session - 08/19/14 1020    Visit Number 4   Number of Visits 6   Date for PT Re-Evaluation 09/02/14   PT Start Time 1020   PT Stop Time 1050   PT Time Calculation (min) 30 min      Past Medical History  Diagnosis Date  . Hypertension   . Bile duct stricture 2013    Past Surgical History  Procedure Laterality Date  . Cholecystectomy  1993  . Back surgery    . Ercp  05/04/2011    Procedure: ENDOSCOPIC RETROGRADE CHOLANGIOPANCREATOGRAPHY (ERCP);  Surgeon: Jeryl Columbia, MD;  Location: [facility] ENDOSCOPY;  Service: Endoscopy;  Laterality: N/A;  fluoro  . Eus  06/28/2011    Procedure: LOWER ENDOSCOPIC ULTRASOUND (EUS);  Surgeon: Jeryl Columbia, MD;  Location: [facility] ENDOSCOPY;  Service: Endoscopy;  Laterality: N/A;  Nicole/Katy    . Ercp  06/28/2011    Procedure: ENDOSCOPIC RETROGRADE CHOLANGIOPANCREATOGRAPHY (ERCP);  Surgeon: Jeryl Columbia, MD;  Location: [facility] ENDOSCOPY;  Service: Endoscopy;  Laterality: N/A;  Dr. Watt Climes to do ERCP  . Ercp  07/03/2011    Procedure: ENDOSCOPIC RETROGRADE CHOLANGIOPANCREATOGRAPHY (ERCP);  Surgeon: Wonda Horner, MD;  Location: [facility] ENDOSCOPY;  Service: Endoscopy;  Laterality: N/A;  . Ercp  09/28/2011    Procedure: ENDOSCOPIC RETROGRADE CHOLANGIOPANCREATOGRAPHY (ERCP);  Surgeon: Jeryl Columbia, MD;  Location: [facility] ENDOSCOPY;  Service: Endoscopy;  Laterality: N/A;  Brushing/ stent change/spyglass case--tradd from [place] will be here  . Spyglass cholangioscopy  09/28/2011    Procedure: SPYGLASS CHOLANGIOSCOPY;  Surgeon: Jeryl Columbia, MD;  Location: [facility] ENDOSCOPY;  Service: Endoscopy;  Laterality: N/A;  . Eus  09/28/2011   Procedure: ESOPHAGEAL ENDOSCOPIC ULTRASOUND (EUS) RADIAL;  Surgeon: Jeryl Columbia, MD;  Location: [facility] ENDOSCOPY;  Service: Endoscopy;  Laterality: N/A;  . Fine needle aspiration  09/28/2011    Procedure: FINE NEEDLE ASPIRATION (FNA) LINEAR;  Surgeon: Jeryl Columbia, MD;  Location: [facility] ENDOSCOPY;  Service: Endoscopy;;  . Ercp  11/22/2011    Procedure: ENDOSCOPIC RETROGRADE CHOLANGIOPANCREATOGRAPHY (ERCP);  Surgeon: Jeryl Columbia, MD;  Location: [facility] ENDOSCOPY;  Service: Endoscopy;  Laterality: N/A;    There were no vitals filed for this visit.  Visit Diagnosis:  Shoulder stiffness, right  Pain in joint, shoulder region, right      Subjective Assessment - 08/19/14 1017    Subjective golfed over the weekend without noting increased pain (but had shortened stroke). States dull ache no longer present.  States feels as though is moving better, "more flexible" but still notes pinch/pain in front top of shoulder with stretching            [facility] PT Assessment - 08/19/14 0001    PROM   Right Shoulder Flexion 141 Degrees   Right Shoulder Internal Rotation 57 Degrees   Right Shoulder External Rotation 45 Degrees       TODAY'S TREATMENT: Manual - Grade 4 AP and Caudal glides with mobes into ABD, Flexion, ER, and IR. Contract/Relax into ER and Flexion.TPR R teres and lats with horiz ABD stretch.        PT Education - 08/19/14 1056  Education provided Yes   Education Details seated and standing ER options for stretching HEP   Person(s) Educated Patient   Methods Explanation;Demonstration   Comprehension Verbalized understanding          PT Short Term Goals - 07/30/14 0757    PT SHORT TERM GOAL #1   Title pt independent with initial HEP by 08/01/14   Status Achieved           PT Long Term Goals - 08/19/14 1058    PT LONG TERM GOAL #1   Title R Shoulder AROM WFL all planes by 09/02/14   Status On-going   PT LONG TERM GOAL #2   Title pt able to perform all chores, work duties,  and recreational activities without limit by shoulder LOM or pain by 09/02/14   Status On-going   PT LONG TERM GOAL #3   Title pt independent with advanced HEP as necessary for continued progress by 09/02/14   Status On-going   PT LONG TERM GOAL #4   Title pt states is able to sleep without limit by pain by 09/02/14  continues to improve, only wakes if "stretches" arm in sleep   Status Partially Met               Plan - 08/19/14 1057    Clinical Impression Statement good improvements in Flexion and IR.  ER, however, a little worse this week.  Pt states he has not been able to work with his wife much with HEP for ER which is likely why ROM has decreased in this plane.  Otherwise seems good progress overall.   PT Next Visit Plan Treatments will focus on manual therapy and HEP progressions as ROM improves.  Patient a large out of pocket expense due to high deductible insurance.  Will limit visits to 1-2 units to limit his cost.   Consulted and Agree with Plan of Care Patient        Problem List Patient Active Problem List   Diagnosis Date Noted  . Right shoulder pain 07/10/2014  . Annual physical exam 05/21/2014  . Frontal sinusitis 03/06/2014  . Bile duct stricture   . Acute cholangitis 07/02/2011  . Hypertension 07/02/2011  . CHEST PAIN UNSPECIFIED 11/19/2009    Marvelyn Bouchillon PT, OCS 08/19/2014, 10:59 AM  [facility] [place], [place], [address] Phone: [phone]   Fax:  [phone]

## 2014-08-26 ENCOUNTER — Ambulatory Visit: Payer: BLUE CROSS/BLUE SHIELD | Admitting: Rehabilitation

## 2014-08-26 DIAGNOSIS — M25511 Pain in right shoulder: Secondary | ICD-10-CM

## 2014-08-26 DIAGNOSIS — M25611 Stiffness of right shoulder, not elsewhere classified: Secondary | ICD-10-CM

## 2014-08-26 NOTE — Therapy (Signed)
[facility] [address]  [place], [place], [address] Phone: [phone]   Fax:  [phone]  Physical Therapy Treatment  Patient Details  Name: Jon Phillips MRN: [id number] Date of Birth: 1961/03/27 Referring Provider:  Colon Branch, MD  Encounter Date: 08/26/2014      PT End of Session - 08/26/14 1537    Visit Number 5   Number of Visits 6   Date for PT Re-Evaluation 09/02/14   PT Start Time 6837   PT Stop Time 1605   PT Time Calculation (min) 30 min      Past Medical History  Diagnosis Date  . Hypertension   . Bile duct stricture 2013    Past Surgical History  Procedure Laterality Date  . Cholecystectomy  1993  . Back surgery    . Ercp  05/04/2011    Procedure: ENDOSCOPIC RETROGRADE CHOLANGIOPANCREATOGRAPHY (ERCP);  Surgeon: Jeryl Columbia, MD;  Location: [facility] ENDOSCOPY;  Service: Endoscopy;  Laterality: N/A;  fluoro  . Eus  06/28/2011    Procedure: LOWER ENDOSCOPIC ULTRASOUND (EUS);  Surgeon: Jeryl Columbia, MD;  Location: [facility] ENDOSCOPY;  Service: Endoscopy;  Laterality: N/A;  Nicole/Katy    . Ercp  06/28/2011    Procedure: ENDOSCOPIC RETROGRADE CHOLANGIOPANCREATOGRAPHY (ERCP);  Surgeon: Jeryl Columbia, MD;  Location: [facility] ENDOSCOPY;  Service: Endoscopy;  Laterality: N/A;  Dr. Watt Climes to do ERCP  . Ercp  07/03/2011    Procedure: ENDOSCOPIC RETROGRADE CHOLANGIOPANCREATOGRAPHY (ERCP);  Surgeon: Wonda Horner, MD;  Location: [facility] ENDOSCOPY;  Service: Endoscopy;  Laterality: N/A;  . Ercp  09/28/2011    Procedure: ENDOSCOPIC RETROGRADE CHOLANGIOPANCREATOGRAPHY (ERCP);  Surgeon: Jeryl Columbia, MD;  Location: [facility] ENDOSCOPY;  Service: Endoscopy;  Laterality: N/A;  Brushing/ stent change/spyglass case--tradd from [place] will be here  . Spyglass cholangioscopy  09/28/2011    Procedure: SPYGLASS CHOLANGIOSCOPY;  Surgeon: Jeryl Columbia, MD;  Location: [facility] ENDOSCOPY;  Service: Endoscopy;  Laterality: N/A;  . Eus  09/28/2011   Procedure: ESOPHAGEAL ENDOSCOPIC ULTRASOUND (EUS) RADIAL;  Surgeon: Jeryl Columbia, MD;  Location: [facility] ENDOSCOPY;  Service: Endoscopy;  Laterality: N/A;  . Fine needle aspiration  09/28/2011    Procedure: FINE NEEDLE ASPIRATION (FNA) LINEAR;  Surgeon: Jeryl Columbia, MD;  Location: [facility] ENDOSCOPY;  Service: Endoscopy;;  . Ercp  11/22/2011    Procedure: ENDOSCOPIC RETROGRADE CHOLANGIOPANCREATOGRAPHY (ERCP);  Surgeon: Jeryl Columbia, MD;  Location: [facility] ENDOSCOPY;  Service: Endoscopy;  Laterality: N/A;    There were no vitals filed for this visit.  Visit Diagnosis:  Shoulder stiffness, right  Pain in joint, shoulder region, right      Subjective Assessment - 08/26/14 1538    Subjective Reports going golfing without difficulty but still has problems sleeping. Reports today is a bad day.    Currently in Pain? Yes   Pain Score 1    Pain Descriptors / Indicators Aching;Dull;Sharp            [facility] PT Assessment - 08/26/14 1603    PROM   PROM Assessment Site Shoulder   Right/Left Shoulder Right   Right Shoulder Flexion 140 Degrees   Right Shoulder Internal Rotation 55 Degrees   Right Shoulder External Rotation 45 Degrees          TODAY'S TREATMENT: Manual - Grade 4 AP and Caudal glides with mobes into ABD, Flexion, ER, and IR. Contract/Relax into ER and Flexion.TPR to Rt pec with and without a stretch. Flexion PROM  with stabilizing shoulder blade.             PT Short Term Goals - 07/30/14 0757    PT SHORT TERM GOAL #1   Title pt independent with initial HEP by 08/01/14   Status Achieved           PT Long Term Goals - 08/19/14 1058    PT LONG TERM GOAL #1   Title R Shoulder AROM WFL all planes by 09/02/14   Status On-going   PT LONG TERM GOAL #2   Title pt able to perform all chores, work duties, and recreational activities without limit by shoulder LOM or pain by 09/02/14   Status On-going   PT LONG TERM GOAL #3   Title pt independent with advanced HEP as necessary for  continued progress by 09/02/14   Status On-going   PT LONG TERM GOAL #4   Title pt states is able to sleep without limit by pain by 09/02/14  continues to improve, only wakes if "stretches" arm in sleep   Status Partially Met               Plan - 08/26/14 1617    Clinical Impression Statement Still very painful ROM with minimal change in measurements. Pt did report that today was a bad day so not to expect a great difference. Pt will be out of town for the next week on vacation.     PT Next Visit Plan Treatments will focus on manual therapy and HEP progressions as ROM improves.  Patient a large out of pocket expense due to high deductible insurance.  Will limit visits to 1-2 units to limit his cost.   Consulted and Agree with Plan of Care Patient        Problem List Patient Active Problem List   Diagnosis Date Noted  . Right shoulder pain 07/10/2014  . Annual physical exam 05/21/2014  . Frontal sinusitis 03/06/2014  . Bile duct stricture   . Acute cholangitis 07/02/2011  . Hypertension 07/02/2011  . CHEST PAIN UNSPECIFIED 11/19/2009    Barbette Hair, PTA 08/26/2014, 4:21 PM  [facility] [address]  [place], [place], [address] Phone: [phone]   Fax:  [phone]

## 2014-09-05 ENCOUNTER — Ambulatory Visit: Payer: BLUE CROSS/BLUE SHIELD | Attending: Family Medicine | Admitting: Rehabilitation

## 2014-09-05 ENCOUNTER — Encounter: Payer: Self-pay | Admitting: Rehabilitation

## 2014-09-05 DIAGNOSIS — M25511 Pain in right shoulder: Secondary | ICD-10-CM | POA: Diagnosis present

## 2014-09-05 DIAGNOSIS — M25611 Stiffness of right shoulder, not elsewhere classified: Secondary | ICD-10-CM | POA: Insufficient documentation

## 2014-09-05 NOTE — Therapy (Signed)
[facility] [address]  [place], [place], [address] Phone: [phone]   Fax:  [phone]  Physical Therapy Treatment  Patient Details  Name: Jon Phillips MRN: [id number] Date of Birth: 1960/08/30 Referring Provider:  Dene Gentry, MD  Encounter Date: 09/05/2014      PT End of Session - 09/05/14 0835    Visit Number 6   Number of Visits 6   Date for PT Re-Evaluation 09/02/14   PT Start Time 0800   PT Stop Time 0830   PT Time Calculation (min) 30 min   Activity Tolerance Patient tolerated treatment well      Past Medical History  Diagnosis Date  . Hypertension   . Bile duct stricture 2013    Past Surgical History  Procedure Laterality Date  . Cholecystectomy  1993  . Back surgery    . Ercp  05/04/2011    Procedure: ENDOSCOPIC RETROGRADE CHOLANGIOPANCREATOGRAPHY (ERCP);  Surgeon: Jeryl Columbia, MD;  Location: [facility] ENDOSCOPY;  Service: Endoscopy;  Laterality: N/A;  fluoro  . Eus  06/28/2011    Procedure: LOWER ENDOSCOPIC ULTRASOUND (EUS);  Surgeon: Jeryl Columbia, MD;  Location: [facility] ENDOSCOPY;  Service: Endoscopy;  Laterality: N/A;  Nicole/Katy    . Ercp  06/28/2011    Procedure: ENDOSCOPIC RETROGRADE CHOLANGIOPANCREATOGRAPHY (ERCP);  Surgeon: Jeryl Columbia, MD;  Location: [facility] ENDOSCOPY;  Service: Endoscopy;  Laterality: N/A;  Dr. Watt Climes to do ERCP  . Ercp  07/03/2011    Procedure: ENDOSCOPIC RETROGRADE CHOLANGIOPANCREATOGRAPHY (ERCP);  Surgeon: Wonda Horner, MD;  Location: [facility] ENDOSCOPY;  Service: Endoscopy;  Laterality: N/A;  . Ercp  09/28/2011    Procedure: ENDOSCOPIC RETROGRADE CHOLANGIOPANCREATOGRAPHY (ERCP);  Surgeon: Jeryl Columbia, MD;  Location: [facility] ENDOSCOPY;  Service: Endoscopy;  Laterality: N/A;  Brushing/ stent change/spyglass case--tradd from [place] will be here  . Spyglass cholangioscopy  09/28/2011    Procedure: SPYGLASS CHOLANGIOSCOPY;  Surgeon: Jeryl Columbia, MD;  Location: [facility] ENDOSCOPY;  Service:  Endoscopy;  Laterality: N/A;  . Eus  09/28/2011    Procedure: ESOPHAGEAL ENDOSCOPIC ULTRASOUND (EUS) RADIAL;  Surgeon: Jeryl Columbia, MD;  Location: [facility] ENDOSCOPY;  Service: Endoscopy;  Laterality: N/A;  . Fine needle aspiration  09/28/2011    Procedure: FINE NEEDLE ASPIRATION (FNA) LINEAR;  Surgeon: Jeryl Columbia, MD;  Location: [facility] ENDOSCOPY;  Service: Endoscopy;;  . Ercp  11/22/2011    Procedure: ENDOSCOPIC RETROGRADE CHOLANGIOPANCREATOGRAPHY (ERCP);  Surgeon: Jeryl Columbia, MD;  Location: [facility] ENDOSCOPY;  Service: Endoscopy;  Laterality: N/A;    There were no vitals filed for this visit.  Visit Diagnosis:  Shoulder stiffness, right  Pain in joint, shoulder region, right      Subjective Assessment - 09/05/14 0758    Subjective thinks it is getting better in terms of the pain but still stiff.  Unsure if wanting to continue therapy.  WIll think about it over the weekend.     Currently in Pain? No/denies   Aggravating Factors  reaching   Pain Relieving Factors nothing            [facility] PT Assessment - 09/05/14 0001    AROM   Right Shoulder Flexion 105 Degrees   Right Shoulder ABduction 85 Degrees       TODAY'S TREATMENT: Warm-up: UBE level 2 31mn/1min Pulleys flexion/abd  Recommended for home Doorway ER stretch 3x20" Sleeper stretch 3x20" IR belt stretch attempts 3x10"  Manual - Grade 4 AP and Caudal glides  with mobes into ABD, Flexion, ER, and IR. Contract/Relax into ER and Flexion.Flexion PROM with stabilizing shoulder blade. PROM work all directions.   Pt does report he is doing no stretches at home; only Tband strengthening.  Education on importance of adding some more stretches                           PT Short Term Goals - 07/30/14 0757    PT SHORT TERM GOAL #1   Title pt independent with initial HEP by 08/01/14   Status Achieved           PT Long Term Goals - 08/19/14 1058    PT LONG TERM GOAL #1   Title R Shoulder AROM WFL all planes by  09/02/14   Status On-going   PT LONG TERM GOAL #2   Title pt able to perform all chores, work duties, and recreational activities without limit by shoulder LOM or pain by 09/02/14   Status On-going   PT LONG TERM GOAL #3   Title pt independent with advanced HEP as necessary for continued progress by 09/02/14   Status On-going   PT LONG TERM GOAL #4   Title pt states is able to sleep without limit by pain by 09/02/14  continues to improve, only wakes if "stretches" arm in sleep   Status Partially Met               Plan - 09/05/14 0836    Clinical Impression Statement Tol treatment well.  Able to increase flexion and abd by 10 deg post treatment.  Advised possibly some more sessions of able to afford.  Pt to discuss with primary PT on next visit.    PT Next Visit Plan Treatments will focus on manual therapy and HEP progressions as ROM improves.  Patient a large out of pocket expense due to high deductible insurance.  Will limit visits to 1-2 units to limit his cost.   Consulted and Agree with Plan of Care Patient        Problem List Patient Active Problem List   Diagnosis Date Noted  . Right shoulder pain 07/10/2014  . Annual physical exam 05/21/2014  . Frontal sinusitis 03/06/2014  . Bile duct stricture   . Acute cholangitis 07/02/2011  . Hypertension 07/02/2011  . CHEST PAIN UNSPECIFIED 11/19/2009    Stark Bray, DPT, CMP 09/05/2014, 8:38 AM  [facility] [address]  [address] [place], [place], [address] Phone: [phone]   Fax:  [phone]

## 2014-09-11 ENCOUNTER — Ambulatory Visit: Payer: BLUE CROSS/BLUE SHIELD | Admitting: Physical Therapy

## 2014-09-11 DIAGNOSIS — M25611 Stiffness of right shoulder, not elsewhere classified: Secondary | ICD-10-CM

## 2014-09-11 DIAGNOSIS — M25511 Pain in right shoulder: Secondary | ICD-10-CM

## 2014-09-11 NOTE — Therapy (Addendum)
[facility] [address]  [place], [place], [address] Phone: [phone]   Fax:  [phone]  Physical Therapy Treatment  Patient Details  Name: Jon Phillips MRN: [id number] Date of Birth: 10-Jul-1960 Referring Provider:  Dene Gentry, MD  Encounter Date: 09/11/2014      PT End of Session - 09/11/14 0717    Visit Number 7   Number of Visits 12   Date for PT Re-Evaluation 10/23/14   PT Start Time 7673   PT Stop Time 0750   PT Time Calculation (min) 33 min      Past Medical History  Diagnosis Date  . Hypertension   . Bile duct stricture 2013    Past Surgical History  Procedure Laterality Date  . Cholecystectomy  1993  . Back surgery    . Ercp  05/04/2011    Procedure: ENDOSCOPIC RETROGRADE CHOLANGIOPANCREATOGRAPHY (ERCP);  Surgeon: Jeryl Columbia, MD;  Location: [facility] ENDOSCOPY;  Service: Endoscopy;  Laterality: N/A;  fluoro  . Eus  06/28/2011    Procedure: LOWER ENDOSCOPIC ULTRASOUND (EUS);  Surgeon: Jeryl Columbia, MD;  Location: [facility] ENDOSCOPY;  Service: Endoscopy;  Laterality: N/A;  Nicole/Katy    . Ercp  06/28/2011    Procedure: ENDOSCOPIC RETROGRADE CHOLANGIOPANCREATOGRAPHY (ERCP);  Surgeon: Jeryl Columbia, MD;  Location: [facility] ENDOSCOPY;  Service: Endoscopy;  Laterality: N/A;  Dr. Watt Climes to do ERCP  . Ercp  07/03/2011    Procedure: ENDOSCOPIC RETROGRADE CHOLANGIOPANCREATOGRAPHY (ERCP);  Surgeon: Wonda Horner, MD;  Location: [facility] ENDOSCOPY;  Service: Endoscopy;  Laterality: N/A;  . Ercp  09/28/2011    Procedure: ENDOSCOPIC RETROGRADE CHOLANGIOPANCREATOGRAPHY (ERCP);  Surgeon: Jeryl Columbia, MD;  Location: [facility] ENDOSCOPY;  Service: Endoscopy;  Laterality: N/A;  Brushing/ stent change/spyglass case--tradd from [place] will be here  . Spyglass cholangioscopy  09/28/2011    Procedure: SPYGLASS CHOLANGIOSCOPY;  Surgeon: Jeryl Columbia, MD;  Location: [facility] ENDOSCOPY;  Service: Endoscopy;  Laterality: N/A;  . Eus  09/28/2011     Procedure: ESOPHAGEAL ENDOSCOPIC ULTRASOUND (EUS) RADIAL;  Surgeon: Jeryl Columbia, MD;  Location: [facility] ENDOSCOPY;  Service: Endoscopy;  Laterality: N/A;  . Fine needle aspiration  09/28/2011    Procedure: FINE NEEDLE ASPIRATION (FNA) LINEAR;  Surgeon: Jeryl Columbia, MD;  Location: [facility] ENDOSCOPY;  Service: Endoscopy;;  . Ercp  11/22/2011    Procedure: ENDOSCOPIC RETROGRADE CHOLANGIOPANCREATOGRAPHY (ERCP);  Surgeon: Jeryl Columbia, MD;  Location: [facility] ENDOSCOPY;  Service: Endoscopy;  Laterality: N/A;    There were no vitals filed for this visit.  Visit Diagnosis:  Shoulder stiffness, right  Pain in joint, shoulder region, right      Subjective Assessment - 09/11/14 0718    Subjective States is feeling good but still "annoying"  rating pain 2-3/10.  States is not getting better with regard to mobility, just decreasing pain.  States still unable to sleep through the night due to R shoulder pain limiting sleeping postures.  He admits to intermittent compliance with HEP lately stating does not perform every day.   Currently in Pain? Yes   Pain Score --  2-3/10   Pain Location Shoulder   Pain Orientation Right;Anterior            [facility] PT Assessment - 09/11/14 0001    PROM   Right Shoulder Flexion 142 Degrees   Right Shoulder Internal Rotation 60 Degrees  at ~80 ABD   Right Shoulder External Rotation 47 Degrees  53 with AP  pressure - both at ~80 ABD       TODAY'S TREATMENT: Manual - Grade 4 AP and Caudal glides with mobes into various degrees of ABD, Flexion, ER, and IR. Contract/Relax into ER and Flexion.           PT Education - 09/11/14 1032    Education provided Yes   Education Details showed pt overdoor pulley system and how to purchase for home use to assist with ROM   Person(s) Educated Patient   Methods Explanation   Comprehension Verbalized understanding          PT Short Term Goals - 09/11/14 1035    PT SHORT TERM GOAL #1   Title pt independent with initial HEP  by 08/01/14   Status Achieved           PT Long Term Goals - 09/11/14 1036    PT LONG TERM GOAL #1   Title R Shoulder AROM WFL all planes by 09/02/14   Status On-going   PT LONG TERM GOAL #2   Title pt able to perform all chores, work duties, and recreational activities without limit by shoulder LOM or pain by 09/02/14   Status On-going   PT LONG TERM GOAL #3   Title pt independent with advanced HEP as necessary for continued progress by 09/02/14   Status On-going   PT LONG TERM GOAL #4   Title pt states is able to sleep without limit by pain by 09/02/14   Status On-going               Plan - 09/11/14 1033    Clinical Impression Statement pt c/o intense pain with manual therapy but states returns to baseline shortly following treatment.  He displays a few degrees improvement each session but still very restricted R Shoulder ROM. He states he's not interested in injections and wants to continue with HEP and PT 1x every 1-2 weeks for a few more weeks.   Pt will benefit from skilled therapeutic intervention in order to improve on the following deficits Decreased mobility;Pain;Decreased range of motion;Impaired flexibility;Improper body mechanics;Decreased strength   PT Frequency 1x / week   PT Duration 6 weeks   PT Treatment/Interventions Manual techniques;Dry needling;Therapeutic exercise;Therapeutic activities   PT Next Visit Plan Treatments will focus on manual therapy and HEP progressions as ROM improves.  Patient a large out of pocket expense due to high deductible insurance.  Will limit visits to 1-2 units to limit his cost.   Consulted and Agree with Plan of Care Patient        Problem List Patient Active Problem List   Diagnosis Date Noted  . Right shoulder pain 07/10/2014  . Annual physical exam 05/21/2014  . Frontal sinusitis 03/06/2014  . Bile duct stricture   . Acute cholangitis 07/02/2011  . Hypertension 07/02/2011  . CHEST PAIN UNSPECIFIED 11/19/2009     Kori Colin PT, OCS 09/11/2014, 10:39 AM  [facility] [address]  [place], [place], [address] Phone: [phone]   Fax:  [phone]     PHYSICAL THERAPY DISCHARGE SUMMARY  Visits from Start of Care: 7  Current functional level related to goals / functional outcomes: unknown   Remaining deficits: Pain and shoulder LOM   Education / Equipment: HEP Plan: Patient agrees to discharge.  Patient goals were not met. Patient is being discharged due to not returning since the last visit.  ?????  Mr. Mounts was last seen on 09/11/14 at which time we discussed physical therapy POC and he requested continued PT 1x every 1-2 weeks and yet he hasn't returned since 09/11/14 and is therefore being discharged from our care at this time.  Jon Phillips PT, OCS 10/20/2014 9:16 AM

## 2014-09-26 ENCOUNTER — Telehealth: Payer: Self-pay | Admitting: Internal Medicine

## 2014-09-26 NOTE — Telephone Encounter (Signed)
Records from GI reviewed: EGD and colonoscopy 12-2009: EGD showed some erythema, biopsy showed no H. pylori. Colonoscopy showed polyps, + tubular adenoma, next colonoscopy in 5 years. Advise patient: Due for a colonoscopy 12-2014, please refer to GI.

## 2014-09-29 NOTE — Telephone Encounter (Signed)
Letter printed and mailed to Pt informing him that he will be due to have repeat cscope in 12/2014. Informed Pt to call if he would like to proceed with cscope we can refer him to GI.

## 2015-04-08 ENCOUNTER — Encounter: Payer: Self-pay | Admitting: Internal Medicine

## 2015-04-30 ENCOUNTER — Other Ambulatory Visit: Payer: Self-pay | Admitting: Gastroenterology

## 2015-04-30 LAB — HM COLONOSCOPY

## 2015-05-18 ENCOUNTER — Encounter: Payer: Self-pay | Admitting: Internal Medicine

## 2015-06-03 ENCOUNTER — Encounter: Payer: BLUE CROSS/BLUE SHIELD | Admitting: Internal Medicine

## 2015-06-16 ENCOUNTER — Telehealth: Payer: Self-pay

## 2015-06-16 NOTE — Telephone Encounter (Signed)
Pre Visit call made to patient. Left message for call back.  

## 2015-06-16 NOTE — Telephone Encounter (Signed)
Completed Pre-Visit call.

## 2015-06-17 ENCOUNTER — Ambulatory Visit (INDEPENDENT_AMBULATORY_CARE_PROVIDER_SITE_OTHER): Payer: BLUE CROSS/BLUE SHIELD | Admitting: Internal Medicine

## 2015-06-17 ENCOUNTER — Encounter: Payer: Self-pay | Admitting: Internal Medicine

## 2015-06-17 VITALS — BP 126/70 | HR 61 | Temp 97.8°F | Ht 75.0 in | Wt 257.1 lb

## 2015-06-17 DIAGNOSIS — Z0000 Encounter for general adult medical examination without abnormal findings: Secondary | ICD-10-CM | POA: Diagnosis not present

## 2015-06-17 DIAGNOSIS — Z09 Encounter for follow-up examination after completed treatment for conditions other than malignant neoplasm: Secondary | ICD-10-CM | POA: Insufficient documentation

## 2015-06-17 LAB — CBC WITH DIFFERENTIAL/PLATELET
BASOS PCT: 0.5 % (ref 0.0–3.0)
Basophils Absolute: 0 10*3/uL (ref 0.0–0.1)
Eosinophils Absolute: 0.2 10*3/uL (ref 0.0–0.7)
Eosinophils Relative: 2 % (ref 0.0–5.0)
HEMATOCRIT: 44.9 % (ref 39.0–52.0)
Hemoglobin: 15.5 g/dL (ref 13.0–17.0)
Lymphocytes Relative: 24 % (ref 12.0–46.0)
Lymphs Abs: 2.1 10*3/uL (ref 0.7–4.0)
MCHC: 34.5 g/dL (ref 30.0–36.0)
MCV: 90.3 fl (ref 78.0–100.0)
MONO ABS: 0.6 10*3/uL (ref 0.1–1.0)
Monocytes Relative: 7 % (ref 3.0–12.0)
Neutro Abs: 5.9 10*3/uL (ref 1.4–7.7)
Neutrophils Relative %: 66.5 % (ref 43.0–77.0)
PLATELETS: 223 10*3/uL (ref 150.0–400.0)
RBC: 4.97 Mil/uL (ref 4.22–5.81)
RDW: 13.2 % (ref 11.5–15.5)
WBC: 8.9 10*3/uL (ref 4.0–10.5)

## 2015-06-17 LAB — LIPID PANEL
CHOL/HDL RATIO: 4
Cholesterol: 160 mg/dL (ref 0–200)
HDL: 43.5 mg/dL (ref >39.00)
LDL CALC: 92 mg/dL (ref 0–99)
NonHDL: 116.15
Triglycerides: 123 mg/dL (ref 0.0–149.0)
VLDL: 24.6 mg/dL (ref 0.0–40.0)

## 2015-06-17 LAB — BASIC METABOLIC PANEL
BUN: 10 mg/dL (ref 6–23)
CO2: 24 meq/L (ref 19–32)
Calcium: 9.2 mg/dL (ref 8.4–10.5)
Chloride: 107 mEq/L (ref 96–112)
Creatinine, Ser: 0.91 mg/dL (ref 0.40–1.50)
GFR: 91.56 mL/min (ref >60.00)
GLUCOSE: 94 mg/dL (ref 70–99)
Potassium: 4.1 mEq/L (ref 3.5–5.1)
Sodium: 139 mEq/L (ref 135–145)

## 2015-06-17 MED ORDER — HYDROCORTISONE 2.5 % EX CREA: TOPICAL_CREAM | Freq: Two times a day (BID) | CUTANEOUS | Status: DC

## 2015-06-17 NOTE — Assessment & Plan Note (Addendum)
Td 2016  pros and cons of the zostavax discussed. Reports he had a colonoscopy at age 55, repeat colonoscopy 04-2015, had polyps >> see report, next per GI. Labs Diet and exercise discussed

## 2015-06-17 NOTE — Progress Notes (Signed)
Subjective:    Patient ID: Jon Phillips, male    DOB: 11-Jun-1960, 55 y.o.   MRN: [id number]  DOS:  06/17/2015 Type of visit - description : CPX Interval history: No concerns    Review of Systems  Constitutional: No fever. No chills. No unexplained wt changes. No unusual sweats  HEENT: No dental problems, no ear discharge, no facial swelling, no voice changes. No eye discharge, no eye  redness , no  intolerance to light   Respiratory: No wheezing , no  difficulty breathing. No cough , no mucus production  Cardiovascular: No CP, no leg swelling , no  Palpitations  GI: no nausea, no vomiting, no diarrhea , no  abdominal pain.  No blood in the stools. No dysphagia, no odynophagia    Endocrine: No polyphagia, no polyuria , no polydipsia  GU: No dysuria, gross hematuria, difficulty urinating. No urinary urgency, no frequency.  Musculoskeletal: No joint swellings or unusual aches or pains  Skin: 2 weeks ago noted a couple of lesions at the chest, no itching or hurting.  Allergic, immunologic: No environmental allergies , no  food allergies  Neurological: No dizziness no  syncope. No headaches. No diplopia, no slurred, no slurred speech, no motor deficits, no facial  Numbness  Hematological: No enlarged lymph nodes, no easy bruising , no unusual bleedings  Psychiatry: No suicidal ideas, no hallucinations, no beavior problems, no confusion.  No unusual/severe anxiety, no depression  Past Medical History  Diagnosis Date  . Hypertension   . Bile duct stricture 2013    Past Surgical History  Procedure Laterality Date  . Cholecystectomy  1993  . Back surgery    . Ercp  05/04/2011    Procedure: ENDOSCOPIC RETROGRADE CHOLANGIOPANCREATOGRAPHY (ERCP);  Surgeon: Jeryl Columbia, MD;  Location: [facility] ENDOSCOPY;  Service: Endoscopy;  Laterality: N/A;  fluoro  . Eus  06/28/2011    Procedure: LOWER ENDOSCOPIC ULTRASOUND (EUS);  Surgeon: Jeryl Columbia, MD;  Location: [facility] ENDOSCOPY;   Service: Endoscopy;  Laterality: N/A;  Nicole/Katy    . Ercp  06/28/2011    Procedure: ENDOSCOPIC RETROGRADE CHOLANGIOPANCREATOGRAPHY (ERCP);  Surgeon: Jeryl Columbia, MD;  Location: [facility] ENDOSCOPY;  Service: Endoscopy;  Laterality: N/A;  Dr. Watt Climes to do ERCP  . Ercp  07/03/2011    Procedure: ENDOSCOPIC RETROGRADE CHOLANGIOPANCREATOGRAPHY (ERCP);  Surgeon: Wonda Horner, MD;  Location: [facility] ENDOSCOPY;  Service: Endoscopy;  Laterality: N/A;  . Ercp  09/28/2011    Procedure: ENDOSCOPIC RETROGRADE CHOLANGIOPANCREATOGRAPHY (ERCP);  Surgeon: Jeryl Columbia, MD;  Location: [facility] ENDOSCOPY;  Service: Endoscopy;  Laterality: N/A;  Brushing/ stent change/spyglass case--tradd from [place] will be here  . Spyglass cholangioscopy  09/28/2011    Procedure: SPYGLASS CHOLANGIOSCOPY;  Surgeon: Jeryl Columbia, MD;  Location: [facility] ENDOSCOPY;  Service: Endoscopy;  Laterality: N/A;  . Eus  09/28/2011    Procedure: ESOPHAGEAL ENDOSCOPIC ULTRASOUND (EUS) RADIAL;  Surgeon: Jeryl Columbia, MD;  Location: [facility] ENDOSCOPY;  Service: Endoscopy;  Laterality: N/A;  . Fine needle aspiration  09/28/2011    Procedure: FINE NEEDLE ASPIRATION (FNA) LINEAR;  Surgeon: Jeryl Columbia, MD;  Location: [facility] ENDOSCOPY;  Service: Endoscopy;;  . Ercp  11/22/2011    Procedure: ENDOSCOPIC RETROGRADE CHOLANGIOPANCREATOGRAPHY (ERCP);  Surgeon: Jeryl Columbia, MD;  Location: [facility] ENDOSCOPY;  Service: Endoscopy;  Laterality: N/A;    Social History   Social History  . Marital Status: Married    Spouse Name: N/A  . Number of Children: 2  . Years  of Education: N/A   Occupational History  . Full time - Sales promotion account executive    Social History Main Topics  . Smoking status: Never Smoker   . Smokeless tobacco: Never Used     Comment: Occasional cigars only  . Alcohol Use: 1.2 oz/week    2 Cans of beer per week     Comment: weekends  . Drug Use: No  . Sexual Activity: Not on file   Other Topics Concern  . Not on file   Social History Narrative   son 49, daughter 59      Family History  Problem Relation Age of Onset  . Hypertension Mother   . Hypertension Father   . Malignant hyperthermia Neg Hx   . CAD Neg Hx   . Diabetes Neg Hx   . Colon cancer Neg Hx   . Prostate cancer Neg Hx        Medication List       This list is accurate as of: 06/17/15  1:59 PM.  Always use your most recent med list.               hydrocortisone 2.5 % cream  Apply topically 2 (two) times daily.     lisinopril 20 MG tablet  Commonly known as:  PRINIVIL,ZESTRIL  Take 1 tablet (20 mg total) by mouth daily.     valACYclovir 1000 MG tablet  Commonly known as:  VALTREX  Take 1 tablet (1,000 mg total) by mouth 2 (two) times daily. Take a total of 2 tablets for each  episode of cold sores           Objective:   Physical Exam  Skin:      BP 126/70 mmHg  Pulse 61  Temp(Src) 97.8 F (36.6 C) (Oral)  Ht 6\' 3"  (1.905 m)  Wt 257 lb 2 oz (116.631 kg)  BMI 32.14 kg/m2  SpO2 97%  General:   Well developed, well nourished . NAD.  Neck: No  thyromegaly , normal carotid pulse HEENT:  Normocephalic . Face symmetric, atraumatic Lungs:  CTA B Normal respiratory effort, no intercostal retractions, no accessory muscle use. Heart: RRR,  no murmur.  No pretibial edema bilaterally  Abdomen:  Not distended, soft, non-tender. No rebound or rigidity.   Neurologic:  alert & oriented X3.  Speech normal, gait appropriate for age and unassisted Strength symmetric and appropriate for age.  Psych: Cognition and judgment appear intact.  Cooperative with normal attention span and concentration.  Behavior appropriate. No anxious or depressed appearing.    Assessment & Plan:   Plan: HTN: Continue lisinopril, checking labs, monitor BPs once a month. Cold sores: Valtrex as needed Rash: Skin lesions, etiology unclear, the back, abdomen are not affected. Lesions seem to be drying up. Recommend to use topical steroids and call if not better RTC one year

## 2015-06-17 NOTE — Assessment & Plan Note (Signed)
HTN: Continue lisinopril, checking labs, monitor BPs once a month. Cold sores: Valtrex as needed Rash: Skin lesions, etiology unclear, the back, abdomen are not affected. Lesions seem to be drying up. Recommend to use topical steroids and call if not better RTC one year

## 2015-06-17 NOTE — Progress Notes (Signed)
Pre visit review using our clinic review tool, if applicable. No additional management support is needed unless otherwise documented below in the visit note. 

## 2015-06-17 NOTE — Patient Instructions (Signed)
GO TO THE LAB :      Get the blood work     GO TO THE FRONT DESK Schedule your next appointment for a  physical When?   1 year Fasting?  Yes    Use the cream twice a day for a few days, if not improving let me know    Check the  blood pressure  monthly   Be sure your blood pressure is between 110/65 and  145/85. If it is consistently higher or lower, let me know

## 2015-06-19 ENCOUNTER — Encounter: Payer: Self-pay | Admitting: Internal Medicine

## 2015-06-19 MED ORDER — LISINOPRIL 20 MG PO TABS: 20.0000 mg | ORAL_TABLET | Freq: Every day | ORAL | Status: DC

## 2015-06-19 NOTE — Telephone Encounter (Signed)
Rx sent 

## 2016-03-16 ENCOUNTER — Ambulatory Visit (INDEPENDENT_AMBULATORY_CARE_PROVIDER_SITE_OTHER): Payer: 59 | Admitting: Internal Medicine

## 2016-03-16 ENCOUNTER — Encounter: Payer: Self-pay | Admitting: Internal Medicine

## 2016-03-16 ENCOUNTER — Ambulatory Visit (HOSPITAL_BASED_OUTPATIENT_CLINIC_OR_DEPARTMENT_OTHER)
Admission: RE | Admit: 2016-03-16 | Discharge: 2016-03-16 | Disposition: A | Discharge status: Home / Self Care | Payer: 59 | Source: Ambulatory Visit | Attending: Internal Medicine | Admitting: Internal Medicine

## 2016-03-16 VITALS — BP 124/72 | HR 61 | Temp 98.1°F | Resp 14 | Ht 75.0 in | Wt 255.2 lb

## 2016-03-16 DIAGNOSIS — M79671 Pain in right foot: Secondary | ICD-10-CM | POA: Insufficient documentation

## 2016-03-16 DIAGNOSIS — M7731 Calcaneal spur, right foot: Secondary | ICD-10-CM | POA: Diagnosis not present

## 2016-03-16 LAB — CBC WITH DIFFERENTIAL/PLATELET
BASOS ABS: 0 10*3/uL (ref 0.0–0.1)
Basophils Relative: 0.5 % (ref 0.0–3.0)
Eosinophils Absolute: 0.2 10*3/uL (ref 0.0–0.7)
Eosinophils Relative: 1.8 % (ref 0.0–5.0)
HCT: 43.2 % (ref 39.0–52.0)
Hemoglobin: 14.8 g/dL (ref 13.0–17.0)
LYMPHS PCT: 21.9 % (ref 12.0–46.0)
Lymphs Abs: 2.1 10*3/uL (ref 0.7–4.0)
MCHC: 34.3 g/dL (ref 30.0–36.0)
MCV: 88.7 fl (ref 78.0–100.0)
Monocytes Absolute: 0.6 10*3/uL (ref 0.1–1.0)
Monocytes Relative: 6.1 % (ref 3.0–12.0)
Neutro Abs: 6.5 10*3/uL (ref 1.4–7.7)
Neutrophils Relative %: 69.7 % (ref 43.0–77.0)
Platelets: 209 10*3/uL (ref 150.0–400.0)
RBC: 4.87 Mil/uL (ref 4.22–5.81)
RDW: 12.9 % (ref 11.5–15.5)
WBC: 9.4 10*3/uL (ref 4.0–10.5)

## 2016-03-16 LAB — BASIC METABOLIC PANEL
BUN: 7 mg/dL (ref 6–23)
CALCIUM: 9.1 mg/dL (ref 8.4–10.5)
CO2: 29 mEq/L (ref 19–32)
Chloride: 104 mEq/L (ref 96–112)
Creatinine, Ser: 0.88 mg/dL (ref 0.40–1.50)
GFR: 94.92 mL/min (ref >60.00)
Glucose, Bld: 84 mg/dL (ref 70–99)
Potassium: 4.1 mEq/L (ref 3.5–5.1)
SODIUM: 139 meq/L (ref 135–145)

## 2016-03-16 LAB — URIC ACID: Uric Acid, Serum: 6.3 mg/dL (ref 4.0–7.8)

## 2016-03-16 LAB — SEDIMENTATION RATE: Sed Rate: 5 mm/hr (ref 0–20)

## 2016-03-16 MED ORDER — COLCHICINE 0.6 MG PO TABS: 0.6000 mg | ORAL_TABLET | Freq: Two times a day (BID) | ORAL | 0 refills | Status: DC

## 2016-03-16 NOTE — Patient Instructions (Signed)
GO TO THE LAB : Get the blood work      STOP BY THE FIRST FLOOR:  get the XR    Leg elevation ICE  IBUPROFEN (Advil or Motrin) 200 mg 2 tablets every 6 hours as needed for pain.  Always take it with food because may cause gastritis and ulcers.  If you notice nausea, stomach pain, change in the color of stools --->  Stop the medicine and let us know

## 2016-03-16 NOTE — Progress Notes (Signed)
Pre visit review using our clinic review tool, if applicable. No additional management support is needed unless otherwise documented below in the visit note. 

## 2016-03-16 NOTE — Progress Notes (Signed)
Subjective:    Patient ID: Jon Phillips, male    DOB: 13-Apr-1960, 55 y.o.   MRN: [id number]  DOS:  03/16/2016 Type of visit - description : acute Interval history: Symptoms started gradually ~ 5 days ago: Right foot swelling, puffiness, pain with walking. He denies any injury, fever, chills. Has not walk any unusual distances.  He remembers a similar episode 11-2015, it self resolved.   Review of Systems See above  Past Medical History:  Diagnosis Date  . Bile duct stricture 2013  . Hypertension     Past Surgical History:  Procedure Laterality Date  . BACK SURGERY    . CHOLECYSTECTOMY  1993  . ERCP  05/04/2011   Procedure: ENDOSCOPIC RETROGRADE CHOLANGIOPANCREATOGRAPHY (ERCP);  Surgeon: Jeryl Columbia, MD;  Location: [facility] ENDOSCOPY;  Service: Endoscopy;  Laterality: N/A;  fluoro  . ERCP  06/28/2011   Procedure: ENDOSCOPIC RETROGRADE CHOLANGIOPANCREATOGRAPHY (ERCP);  Surgeon: Jeryl Columbia, MD;  Location: [facility] ENDOSCOPY;  Service: Endoscopy;  Laterality: N/A;  Dr. Watt Climes to do ERCP  . ERCP  07/03/2011   Procedure: ENDOSCOPIC RETROGRADE CHOLANGIOPANCREATOGRAPHY (ERCP);  Surgeon: Wonda Horner, MD;  Location: [facility] ENDOSCOPY;  Service: Endoscopy;  Laterality: N/A;  . ERCP  09/28/2011   Procedure: ENDOSCOPIC RETROGRADE CHOLANGIOPANCREATOGRAPHY (ERCP);  Surgeon: Jeryl Columbia, MD;  Location: [facility] ENDOSCOPY;  Service: Endoscopy;  Laterality: N/A;  Brushing/ stent change/spyglass case--tradd from [place] will be here  . ERCP  11/22/2011   Procedure: ENDOSCOPIC RETROGRADE CHOLANGIOPANCREATOGRAPHY (ERCP);  Surgeon: Jeryl Columbia, MD;  Location: [facility] ENDOSCOPY;  Service: Endoscopy;  Laterality: N/A;  . EUS  06/28/2011   Procedure: LOWER ENDOSCOPIC ULTRASOUND (EUS);  Surgeon: Jeryl Columbia, MD;  Location: [facility] ENDOSCOPY;  Service: Endoscopy;  Laterality: N/A;  Nicole/Katy    . EUS  09/28/2011   Procedure: ESOPHAGEAL ENDOSCOPIC ULTRASOUND (EUS) RADIAL;  Surgeon: Jeryl Columbia, MD;  Location: [facility] ENDOSCOPY;   Service: Endoscopy;  Laterality: N/A;  . FINE NEEDLE ASPIRATION  09/28/2011   Procedure: FINE NEEDLE ASPIRATION (FNA) LINEAR;  Surgeon: Jeryl Columbia, MD;  Location: [facility] ENDOSCOPY;  Service: Endoscopy;;  . Bess Kinds CHOLANGIOSCOPY  09/28/2011   Procedure: [id number] CHOLANGIOSCOPY;  Surgeon: Jeryl Columbia, MD;  Location: [facility] ENDOSCOPY;  Service: Endoscopy;  Laterality: N/A;    Social History   Social History  . Marital status: Married    Spouse name: N/A  . Number of children: 2  . Years of education: N/A   Occupational History  . Full time - Sales promotion account executive    Social History Main Topics  . Smoking status: Never Smoker  . Smokeless tobacco: Never Used     Comment: Occasional cigars only  . Alcohol use 1.2 oz/week    2 Cans of beer per week     Comment: weekends  . Drug use: No  . Sexual activity: Not on file   Other Topics Concern  . Not on file   Social History Narrative   son 54, daughter 59        Medication List       Accurate as of 03/16/16 10:27 AM. Always use your most recent med list.          hydrocortisone 2.5 % cream Apply topically 2 (two) times daily.   lisinopril 20 MG tablet Commonly known as:  PRINIVIL,ZESTRIL Take 1 tablet (20 mg total) by mouth daily.   valACYclovir 1000 MG tablet Commonly known as:  VALTREX Take 1 tablet (1,000 mg total) by  mouth 2 (two) times daily. Take a total of 2 tablets for each  episode of cold sores          Objective:   Physical Exam  Musculoskeletal:       Feet:   BP 124/72 (BP Location: Left Arm, Patient Position: Sitting, Cuff Size: Normal)   Pulse 61   Temp 98.1 F (36.7 C) (Oral)   Resp 14   Ht 6\' 3"  (1.905 m)   Wt 255 lb 4 oz (115.8 kg)   SpO2 98%   BMI 31.90 kg/m  General:   Well developed, well nourished . NAD.  HEENT:  Normocephalic . Face symmetric, atraumatic  MSK: Right ankle: No swelling, red, full range of motion Left foot: Normal, good pedal pulses Right foot: See graphic, good  pedal pulse, normal capillary refill Neurologic:  alert & oriented X3.  Speech normal, gait appropriate for age and unassisted Psych--  Cognition and judgment appear intact.  Cooperative with normal attention span and concentration.  Behavior appropriate. No anxious or depressed appearing.      Assessment & Plan:   Assessment HTN Cholecystectomy, 1993 Bile duct stricture 2013  PLAN: R foot pain and swelling: DDX includes gout, occult fracture, doubt cellulitis. Multiple questions answered regards gout although is not yet clear if he has that condition. Gout diet discussed . Plan: CBC, BMP, uric acid, sedimentation rate. Also x-ray, right foot. Recommend ibuprofen, Leg elevation and ice Further advice for results.  Today, I spent more than   25  min with the patient: >50% of the time counseling regards The differential diagnosis included gout, multiple questions about this condition answered to the best of my ability, gout diet discussed, information provided.

## 2016-03-17 ENCOUNTER — Telehealth: Payer: Self-pay

## 2016-03-17 ENCOUNTER — Encounter: Payer: Self-pay | Admitting: Internal Medicine

## 2016-03-17 NOTE — Assessment & Plan Note (Signed)
R foot pain and swelling: DDX includes gout, occult fracture, doubt cellulitis. Multiple questions answered regards gout although is not yet clear if he has that condition. Gout diet discussed . Plan: CBC, BMP, uric acid, sedimentation rate. Also x-ray, right foot. Recommend ibuprofen, Leg elevation and ice Further advice for results.

## 2016-03-17 NOTE — Telephone Encounter (Signed)
PA initiated via Covermymeds; KEY: [id number]. Awaiting determination.

## 2016-03-17 NOTE — Telephone Encounter (Signed)
Patient aware that PA has been started.

## 2016-03-24 NOTE — Telephone Encounter (Signed)
Ok change to [employer] (another brand name for colchicine) 0.6 mg bid prn

## 2016-03-24 NOTE — Telephone Encounter (Signed)
Received PA denial notification. Colchicine can be approved if:   (1) Pt has a history of failure or intolerance to brand Mitigare (2) OR Pt requires dose of 0.3 mg (half of a 0.6 tab) due to one of the following:  A) severe renal impairment (estimated clearance less than 37mL/min)  B) severe hepatic impairment (Child-Pugh score of B OR C)  C) Concomitant use of a CYP3A4 (e.g. Clarithromycin, itraconazole), P-glycoprotein inhibitor (e.g. Cyclosporine) or a protease inhibitor (e.g. Reyataz).  D) Treatment of familial Mediterranean fever  E) Intolerable side effects that cannot be managed by extending the dosing interval.  Information provided does not show that Pt meets these criteria. Please advise.

## 2016-03-25 MED ORDER — MITIGARE 0.6 MG PO CAPS: 1.0000 | ORAL_CAPSULE | Freq: Two times a day (BID) | ORAL | 0 refills | Status: DC | PRN

## 2016-03-25 NOTE — Telephone Encounter (Signed)
Mitigare sent to [facility]. Pt informed via MyChart. PA notification letter sent for scanning.

## 2016-03-31 MED ORDER — INDOMETHACIN 50 MG PO CAPS: 50.0000 mg | ORAL_CAPSULE | Freq: Two times a day (BID) | ORAL | 0 refills | Status: DC

## 2016-03-31 NOTE — Telephone Encounter (Signed)
Colon Branch, MD  Damita Dunnings, [place]        Call indomethacin 50 mg 1 by mouth twice a day when necessary #40, no refills

## 2016-03-31 NOTE — Telephone Encounter (Signed)
Rx sent 

## 2016-03-31 NOTE — Addendum Note (Signed)
Addended byDamita Dunnings D on: 03/31/2016 02:25 PM   Modules accepted: Orders

## 2016-04-04 DIAGNOSIS — D129 Benign neoplasm of anus and anal canal: Secondary | ICD-10-CM

## 2016-04-04 HISTORY — DX: Benign neoplasm of anus and anal canal: D12.9

## 2016-07-05 ENCOUNTER — Encounter: Payer: Self-pay | Admitting: Internal Medicine

## 2016-07-05 MED ORDER — LISINOPRIL 20 MG PO TABS: 20.0000 mg | ORAL_TABLET | Freq: Every day | ORAL | 0 refills | Status: DC

## 2016-08-05 ENCOUNTER — Telehealth: Payer: Self-pay | Admitting: Internal Medicine

## 2016-08-05 NOTE — Telephone Encounter (Signed)
Labs will be completed at time of OV.

## 2016-08-05 NOTE — Telephone Encounter (Signed)
°  Relation to KL:KJZP Call back number:[phone]   Reason for call:  Patient has a scheduled physical appointment for 08/08/16 at 1:30pm and would like to come in Monday morning at 8am for labs only,please advise

## 2016-08-05 NOTE — Telephone Encounter (Signed)
lvm advising patient of message below °

## 2016-08-08 ENCOUNTER — Encounter: Payer: Self-pay | Admitting: Internal Medicine

## 2016-08-08 ENCOUNTER — Ambulatory Visit (INDEPENDENT_AMBULATORY_CARE_PROVIDER_SITE_OTHER): Payer: 59 | Admitting: Internal Medicine

## 2016-08-08 VITALS — BP 126/72 | HR 70 | Temp 98.0°F | Resp 14 | Ht 75.0 in | Wt 250.1 lb

## 2016-08-08 DIAGNOSIS — Z1159 Encounter for screening for other viral diseases: Secondary | ICD-10-CM

## 2016-08-08 DIAGNOSIS — K621 Rectal polyp: Secondary | ICD-10-CM

## 2016-08-08 DIAGNOSIS — Z114 Encounter for screening for human immunodeficiency virus [HIV]: Secondary | ICD-10-CM

## 2016-08-08 DIAGNOSIS — Z0000 Encounter for general adult medical examination without abnormal findings: Secondary | ICD-10-CM | POA: Diagnosis not present

## 2016-08-08 LAB — TSH: TSH: 2.42 u[IU]/mL (ref 0.35–4.50)

## 2016-08-08 LAB — COMPREHENSIVE METABOLIC PANEL
ALK PHOS: 64 U/L (ref 39–117)
ALT: 21 U/L (ref 0–53)
AST: 24 U/L (ref 0–37)
Albumin: 4.4 g/dL (ref 3.5–5.2)
BILIRUBIN TOTAL: 0.9 mg/dL (ref 0.2–1.2)
BUN: 13 mg/dL (ref 6–23)
CALCIUM: 9.3 mg/dL (ref 8.4–10.5)
CO2: 29 mEq/L (ref 19–32)
Chloride: 104 mEq/L (ref 96–112)
Creatinine, Ser: 0.93 mg/dL (ref 0.40–1.50)
GFR: 88.93 mL/min (ref >60.00)
Glucose, Bld: 83 mg/dL (ref 70–99)
POTASSIUM: 4.4 meq/L (ref 3.5–5.1)
Sodium: 139 mEq/L (ref 135–145)
TOTAL PROTEIN: 7.4 g/dL (ref 6.0–8.3)

## 2016-08-08 LAB — LIPID PANEL
CHOL/HDL RATIO: 4
CHOLESTEROL: 176 mg/dL (ref 0–200)
HDL: 43.7 mg/dL (ref >39.00)
LDL Cholesterol: 103 mg/dL — ABNORMAL HIGH (ref 0–99)
NONHDL: 132.78
TRIGLYCERIDES: 147 mg/dL (ref 0.0–149.0)
VLDL: 29.4 mg/dL (ref 0.0–40.0)

## 2016-08-08 LAB — PSA: PSA: 2.39 ng/mL (ref 0.10–4.00)

## 2016-08-08 MED ORDER — LISINOPRIL 20 MG PO TABS: 20.0000 mg | ORAL_TABLET | Freq: Every day | ORAL | 3 refills | Status: DC

## 2016-08-08 MED ORDER — VALACYCLOVIR HCL 1 G PO TABS: 1000.0000 mg | ORAL_TABLET | Freq: Two times a day (BID) | ORAL | 3 refills | Status: DC

## 2016-08-08 NOTE — Patient Instructions (Signed)
GO TO THE LAB : Get the blood work     GO TO THE FRONT DESK Schedule your next appointment for a   physical exam in one year   Check the  blood pressure 2 or 3 times a month  Be sure your blood pressure is between 110/65 and  140/85. If it is consistently higher or lower, let me know

## 2016-08-08 NOTE — Assessment & Plan Note (Addendum)
--  Td 2016 ; shingles shots . --Reports he had a colonoscopy at age 56, repeat colonoscopy 04-2015, had polyps >> see report, next per GI. --Prostate ca screening- prostate feels normal, check a PSA --DRE: question of rectal polyp on DRE, will refer to Dr Watt Climes GI. --Labs: CMP, FLP, TSH, PSA, HIV, hep C --Diet and exercise discussed

## 2016-08-08 NOTE — Progress Notes (Signed)
Subjective:    Patient ID: Jon Phillips, male    DOB: 1960/10/12, 56 y.o.   MRN: [id number]  DOS:  08/08/2016 Type of visit - description : cpx Interval history:  No major concerns   Review of Systems  A 14 point review of systems is negative   Past Medical History:  Diagnosis Date  . Bile duct stricture 2013  . Hypertension     Past Surgical History:  Procedure Laterality Date  . BACK SURGERY    . CHOLECYSTECTOMY  1993  . ERCP  05/04/2011   Procedure: ENDOSCOPIC RETROGRADE CHOLANGIOPANCREATOGRAPHY (ERCP);  Surgeon: Jeryl Columbia, MD;  Location: [facility] ENDOSCOPY;  Service: Endoscopy;  Laterality: N/A;  fluoro  . ERCP  06/28/2011   Procedure: ENDOSCOPIC RETROGRADE CHOLANGIOPANCREATOGRAPHY (ERCP);  Surgeon: Jeryl Columbia, MD;  Location: [facility] ENDOSCOPY;  Service: Endoscopy;  Laterality: N/A;  Dr. Watt Climes to do ERCP  . ERCP  07/03/2011   Procedure: ENDOSCOPIC RETROGRADE CHOLANGIOPANCREATOGRAPHY (ERCP);  Surgeon: Wonda Horner, MD;  Location: [facility] ENDOSCOPY;  Service: Endoscopy;  Laterality: N/A;  . ERCP  09/28/2011   Procedure: ENDOSCOPIC RETROGRADE CHOLANGIOPANCREATOGRAPHY (ERCP);  Surgeon: Jeryl Columbia, MD;  Location: [facility] ENDOSCOPY;  Service: Endoscopy;  Laterality: N/A;  Brushing/ stent change/spyglass case--tradd from [place] will be here  . ERCP  11/22/2011   Procedure: ENDOSCOPIC RETROGRADE CHOLANGIOPANCREATOGRAPHY (ERCP);  Surgeon: Jeryl Columbia, MD;  Location: [facility] ENDOSCOPY;  Service: Endoscopy;  Laterality: N/A;  . EUS  06/28/2011   Procedure: LOWER ENDOSCOPIC ULTRASOUND (EUS);  Surgeon: Jeryl Columbia, MD;  Location: [facility] ENDOSCOPY;  Service: Endoscopy;  Laterality: N/A;  Nicole/Katy    . EUS  09/28/2011   Procedure: ESOPHAGEAL ENDOSCOPIC ULTRASOUND (EUS) RADIAL;  Surgeon: Jeryl Columbia, MD;  Location: [facility] ENDOSCOPY;  Service: Endoscopy;  Laterality: N/A;  . FINE NEEDLE ASPIRATION  09/28/2011   Procedure: FINE NEEDLE ASPIRATION (FNA) LINEAR;  Surgeon: Jeryl Columbia, MD;  Location: [facility] ENDOSCOPY;   Service: Endoscopy;;  . Bess Kinds CHOLANGIOSCOPY  09/28/2011   Procedure: SRPRXYVO CHOLANGIOSCOPY;  Surgeon: Jeryl Columbia, MD;  Location: [facility] ENDOSCOPY;  Service: Endoscopy;  Laterality: N/A;   Family History  Problem Relation Age of Onset  . Hypertension Mother   . Hypertension Father   . Malignant hyperthermia Neg Hx   . CAD Neg Hx   . Diabetes Neg Hx   . Colon cancer Neg Hx   . Prostate cancer Neg Hx     Social History   Social History  . Marital status: Married    Spouse name: N/A  . Number of children: 2  . Years of education: N/A   Occupational History  . Full time - Sales promotion account executive    Social History Main Topics  . Smoking status: Never Smoker  . Smokeless tobacco: Never Used     Comment: Occasional cigars only  . Alcohol use 1.2 oz/week    2 Cans of beer per week     Comment: weekends  . Drug use: No  . Sexual activity: Not on file   Other Topics Concern  . Not on file   Social History Narrative   Son '93 airplane pilot  , daughter '97  ( psychology )      Allergies as of 08/08/2016   No Known Allergies     Medication List       Accurate as of 08/08/16 11:59 PM. Always use your most recent med list.  hydrocortisone 2.5 % cream Apply topically 2 (two) times daily.   indomethacin 50 MG capsule Commonly known as:  INDOCIN Take 1 capsule (50 mg total) by mouth 2 (two) times daily with a meal. As needed.   lisinopril 20 MG tablet Commonly known as:  PRINIVIL,ZESTRIL Take 1 tablet (20 mg total) by mouth daily.   MITIGARE 0.6 MG Caps Generic drug:  Colchicine Take 1 capsule by mouth 2 (two) times daily as needed.   valACYclovir 1000 MG tablet Commonly known as:  VALTREX Take 1 tablet (1,000 mg total) by mouth 2 (two) times daily. Take a total of 2 tablets for each  episode of cold sores          Objective:   Physical Exam BP 126/72 (BP Location: Left Arm, Patient Position: Sitting, Cuff Size: Normal)   Pulse 70   Temp 98 F (36.7  C) (Oral)   Resp 14   Ht 6\' 3"  (1.905 m)   Wt 250 lb 2 oz (113.5 kg)   SpO2 98%   BMI 31.26 kg/m   General:   Well developed, well nourished . NAD.  Neck: No  thyromegaly  HEENT:  Normocephalic . Face symmetric, atraumatic Lungs:  CTA B Normal respiratory effort, no intercostal retractions, no accessory muscle use. Heart: RRR,  no murmur.  No pretibial edema bilaterally  Abdomen:  Not distended, soft, non-tender. No rebound or rigidity.   Skin: Exposed areas without rash. Not pale. Not jaundice DRE: Normal external examination, rectum. question of a small polyp , R side. Prostate: Normal, nontender, symmetric. Neurologic:  alert & oriented X3.  Speech normal, gait appropriate for age and unassisted Strength symmetric and appropriate for age.  Psych: Cognition and judgment appear intact.  Cooperative with normal attention span and concentration.  Behavior appropriate. No anxious or depressed appearing.    Assessment & Plan:   Assessment HTN ?gout  Cholecystectomy, 1993 Bile duct stricture 2013  PLAN: HTN: Seems well-controlled, no change. Pain, R foot. No further issues, gout? RTC 1 year

## 2016-08-08 NOTE — Progress Notes (Signed)
Pre visit review using our clinic review tool, if applicable. No additional management support is needed unless otherwise documented below in the visit note. 

## 2016-08-09 LAB — HIV ANTIBODY (ROUTINE TESTING W REFLEX): HIV 1&2 Ab, 4th Generation: NONREACTIVE

## 2016-08-09 LAB — HEPATITIS C ANTIBODY: HCV Ab: NEGATIVE

## 2016-08-09 NOTE — Assessment & Plan Note (Signed)
HTN: Seems well-controlled, no change. Pain, R foot. No further issues, gout? RTC 1 year

## 2016-08-31 DIAGNOSIS — D129 Benign neoplasm of anus and anal canal: Secondary | ICD-10-CM | POA: Diagnosis not present

## 2017-01-26 IMAGING — DX DG FOOT COMPLETE 3+V*R*
3 series · 3 of 3 positions shown · non-contrast
Comparison: None.

CLINICAL DATA: Pain and swelling; redness

EXAM:
RIGHT FOOT COMPLETE - 3+ VIEW

[foot ap]
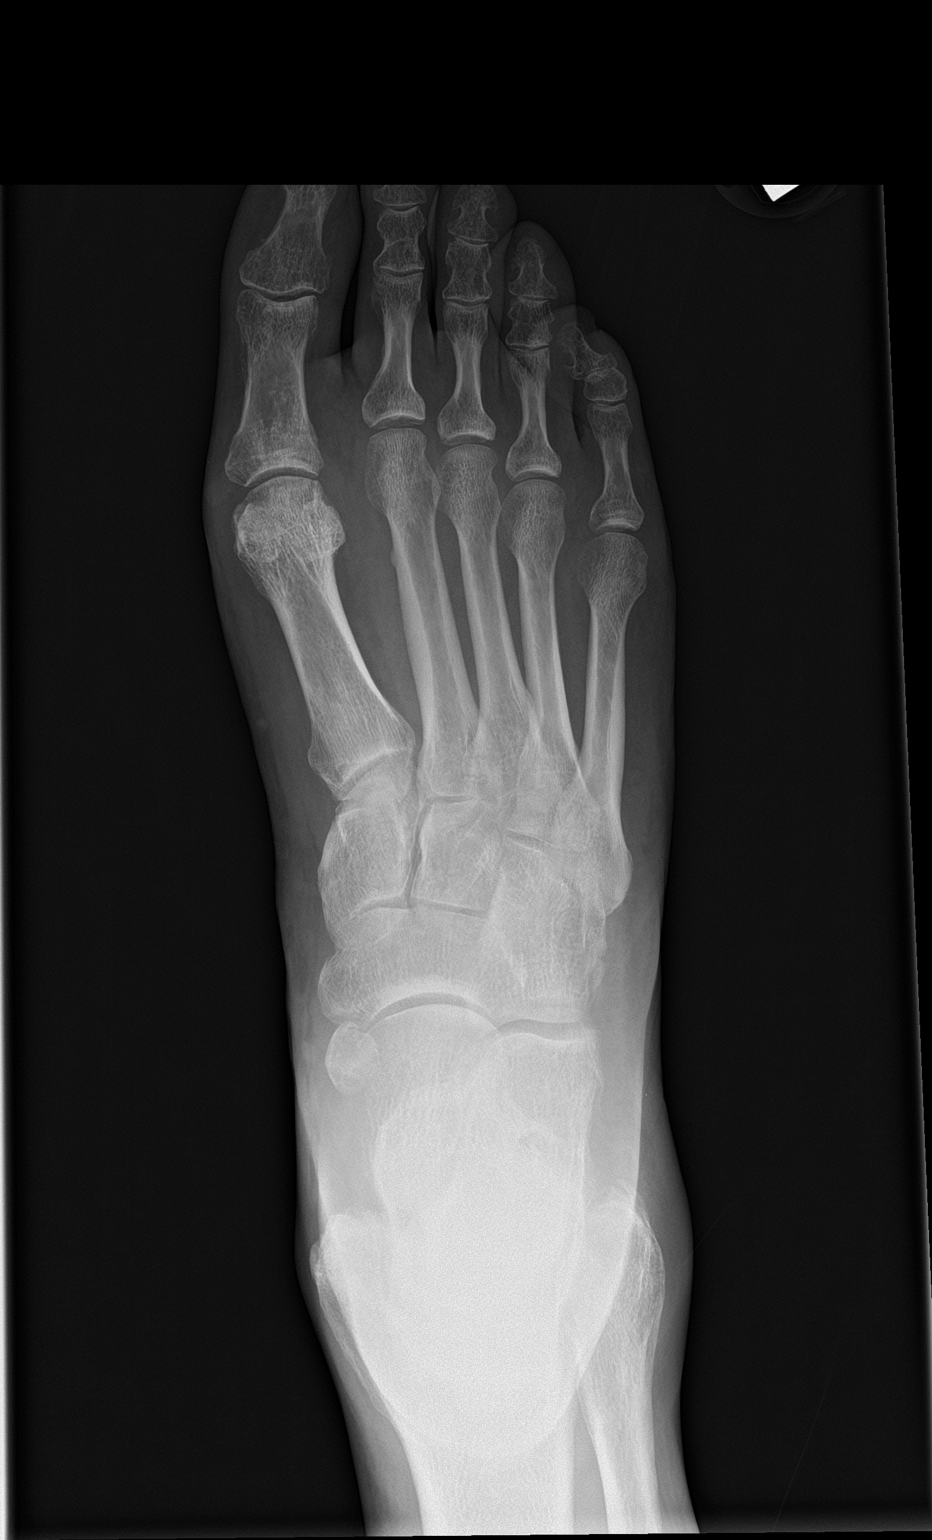

[foot obl]
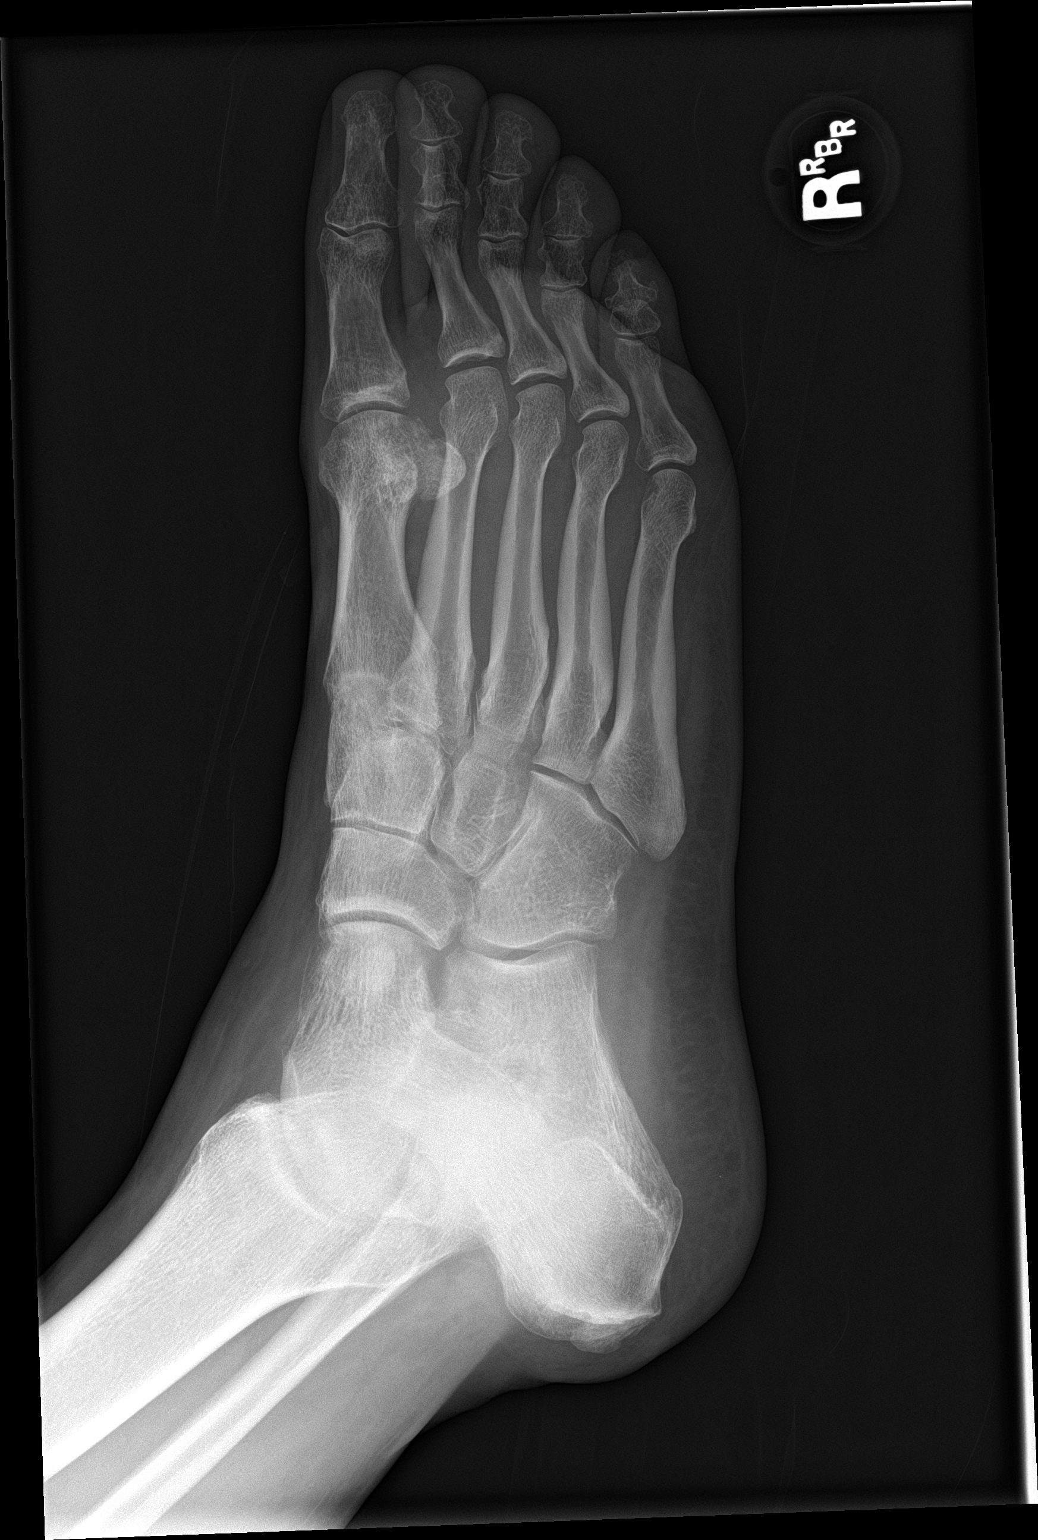

[foot lat]
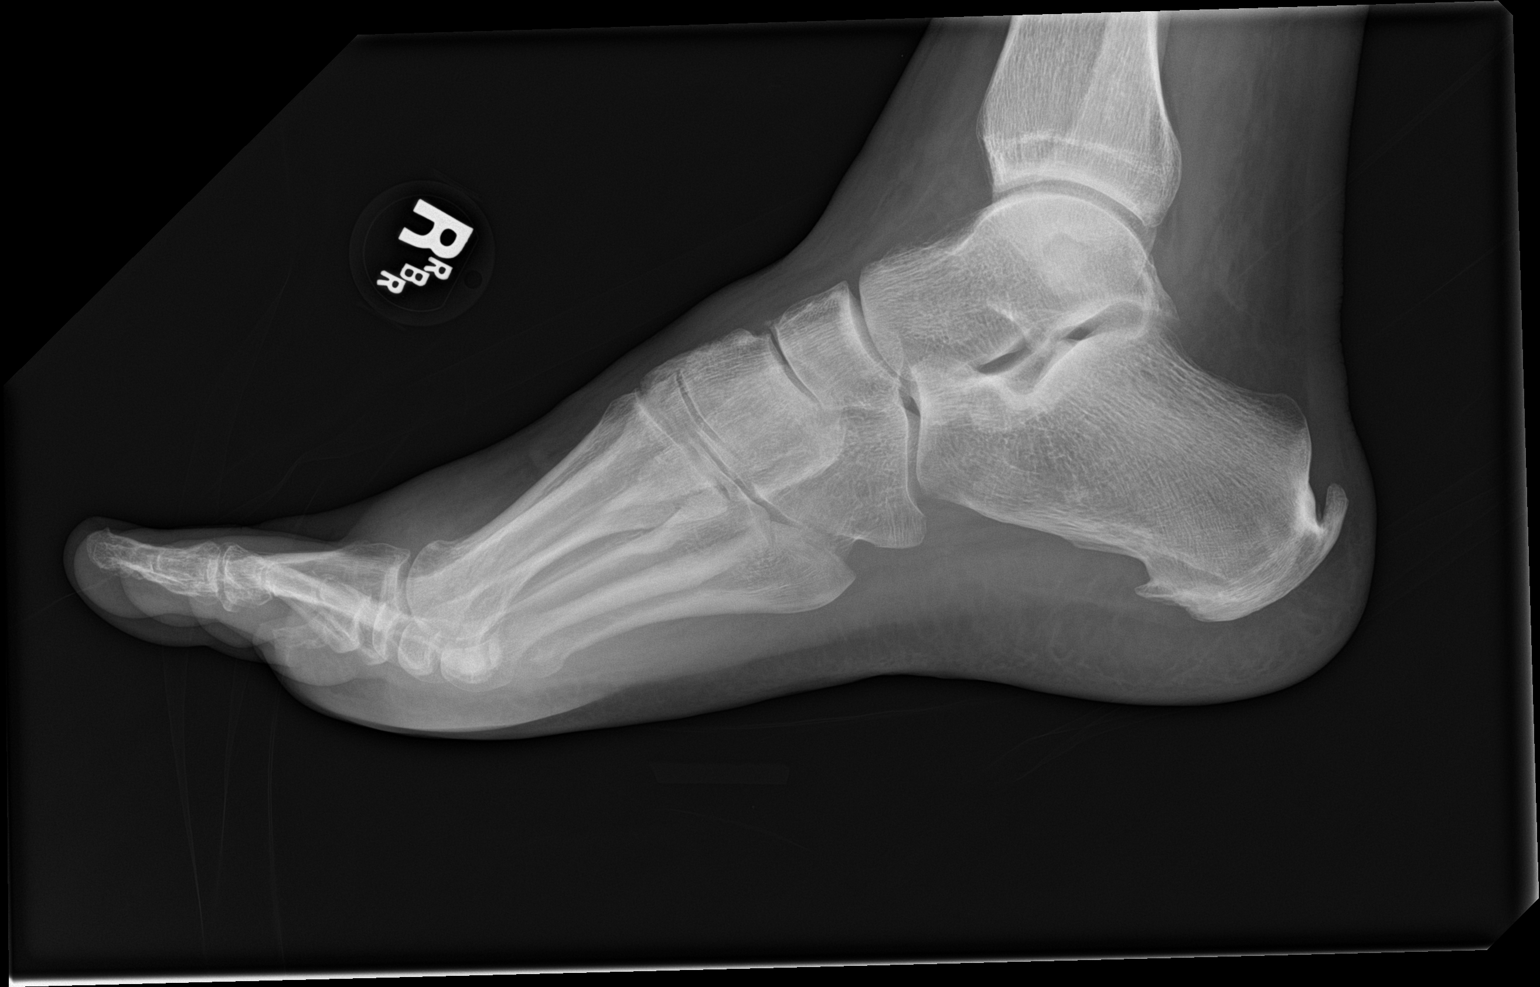

[3 of 3 positions shown; findings below may reference images not displayed]

FINDINGS: Frontal, oblique, and lateral views were obtained. There is no
fracture or dislocation. Joint spaces appear normal. No erosive
change. There is a prominent os naviculare, an anatomic variant.
There are posterior and inferior calcaneal spurs.
IMPRESSION: Calcaneal spurs. No apparent arthropathy. No fracture or
dislocation.

## 2017-02-13 ENCOUNTER — Encounter: Payer: Self-pay | Admitting: Internal Medicine

## 2017-02-13 ENCOUNTER — Ambulatory Visit (INDEPENDENT_AMBULATORY_CARE_PROVIDER_SITE_OTHER): Payer: 59 | Admitting: Internal Medicine

## 2017-02-13 VITALS — BP 128/76 | HR 63 | Temp 97.8°F | Resp 14 | Ht 75.0 in | Wt 255.4 lb

## 2017-02-13 DIAGNOSIS — M109 Gout, unspecified: Secondary | ICD-10-CM | POA: Diagnosis not present

## 2017-02-13 MED ORDER — MITIGARE 0.6 MG PO CAPS: 1.0000 | ORAL_CAPSULE | Freq: Two times a day (BID) | ORAL | 0 refills | Status: DC | PRN

## 2017-02-13 MED ORDER — PREDNISONE 10 MG PO TABS: ORAL_TABLET | ORAL | 2 refills | Status: DC

## 2017-02-13 NOTE — Progress Notes (Signed)
Pre visit review using our clinic review tool, if applicable. No additional management support is needed unless otherwise documented below in the visit note. 

## 2017-02-13 NOTE — Progress Notes (Signed)
Subjective:    Patient ID: Jon Phillips, male    DOB: [birth year], 56 y.o.   MRN: [id number]  DOS:  02/13/2017 Type of visit - description : acute Interval history: Symptoms started 02/11/2017: Pain and swelling at the right foot.  Symptoms are similar to the previous 2 episodes of pain and swelling there. 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     Review of Systems   Past Medical History:  Diagnosis Date  . Benign neoplasm of anus 2018  . Bile duct stricture 2013  . Hypertension     Past Surgical History:  Procedure Laterality Date  . BACK SURGERY    . CHOLECYSTECTOMY  1993    Social History   Socioeconomic History  . Marital status: Married    Spouse name: Not on file  . Number of children: 2  . Years of education: Not on file  . Highest education level: Not on file  Social Needs  . Financial resource strain: Not on file  . Food insecurity - worry: Not on file  . Food insecurity - inability: Not on file  . Transportation needs - medical: Not on file  . Transportation needs - non-medical: Not on file  Occupational History  . Occupation: Full time Social research officer, government  Tobacco Use  . Smoking status: Never Smoker  . Smokeless tobacco: Never Used  . Tobacco comment: Occasional cigars only  Substance and Sexual Activity  . Alcohol use: Yes    Alcohol/week: 1.2 oz    Types: 2 Cans of beer per week    Comment: weekends  . Drug use: No  . Sexual activity: Not on file  Other Topics Concern  . Not on file  Social History Narrative   Son '93 airplane pilot  , daughter '97  ( psychology )      Allergies as of 02/13/2017   No Known Allergies     Medication List        Accurate as of 02/13/17  2:05 PM. Always use your most recent med list.          hydrocortisone 2.5 % cream Apply topically 2 (two) times daily.   indomethacin 50 MG  capsule Commonly known as:  INDOCIN Take 1 capsule (50 mg total) by mouth 2 (two) times daily with a meal. As needed.   lisinopril 20 MG tablet Commonly known as:  PRINIVIL,ZESTRIL Take 1 tablet (20 mg total) by mouth daily.   MITIGARE 0.6 MG Caps Generic drug:  Colchicine Take 1 capsule by mouth 2 (two) times daily as needed.   valACYclovir 1000 MG tablet Commonly known as:  VALTREX Take 1 tablet (1,000 mg total) by mouth 2 (two) times daily. Take a total of 2 tablets for each  episode of cold sores          Objective:   Physical Exam  Musculoskeletal:       Feet:   BP 128/76 (BP Location: Right Arm, Patient Position: Sitting, Cuff Size: Small)   Pulse 63   Temp 97.8 F (36.6 C) (Oral)   Resp 14   Ht 6\' 3"  (1.905 m)   Wt 255 lb 6 oz (115.8 kg)   SpO2 97%   BMI 31.92 kg/m  General:   Well developed, well nourished . NAD.  HEENT:  Normocephalic . Face symmetric, atraumatic SK: See graphic neurologic:  alert &  oriented X3.  Speech normal, gait appropriate for age and unassisted Psych--  Cognition and judgment appear intact.  Cooperative with normal attention span and concentration.  Behavior appropriate. No anxious or depressed appearing.      Assessment & Plan:   Assessment HTN ?gout  Cholecystectomy, 1993 Bile duct stricture 2013  PLAN: Gout: This is the third episode of pain, swelling at the right foot, on clinical grounds I suspect gout.  Last time we check a uric gout was however (-) but that does not r/o gout. Extensive discussion about the DX of gout. Diet, acute treatment with colchicine prednisone and long-term treatment with allopurinol or Uloric discussed (Uloric preferred if cost is not an issue). We will check a uric acid.  For now elected not to pursue long-term treatment. Continue colchicine twice a day, start prednisone. Future acute episodes: ok self start colchicine and prednisone, call if symptoms severe or different. Call at some  point if he likes to start long-term medication. This is a clinical dx so I offer  a rheumatology referral to confirm, will call if he likes to proceed.  Today, I spent more than   25  min with the patient: >50% of the time counseling regards to etiology and mechanism of an acute episode of gout, also the long-term options for treatment.  Multiple questions asked, they were answered to the best of my ability.  Diet discussed.

## 2017-02-13 NOTE — Patient Instructions (Signed)
GO TO THE LAB : Get the blood work     Colchicine twice a day until better  Prednisone for a few days  Watch your diet  If you experience another episode: Start colchicine right away and start a prednisone taper  Call if symptoms severe, you have fever chills or any unusual symptoms    Gout Gout is painful swelling that can occur in some of your joints. Gout is a type of arthritis. This condition is caused by having too much uric acid in your body. Uric acid is a chemical that forms when your body breaks down substances called purines. Purines are important for building body proteins. When your body has too much uric acid, sharp crystals can form and build up inside your joints. This causes pain and swelling. Gout attacks can happen quickly and be very painful (acute gout). Over time, the attacks can affect more joints and become more frequent (chronic gout). Gout can also cause uric acid to build up under your skin and inside your kidneys. What are the causes? This condition is caused by too much uric acid in your blood. This can occur because:  Your kidneys do not remove enough uric acid from your blood. This is the most common cause.  Your body makes too much uric acid. This can occur with some cancers and cancer treatments. It can also occur if your body is breaking down too many red blood cells (hemolytic anemia).  You eat too many foods that are high in purines. These foods include organ meats and some seafood. Alcohol, especially beer, is also high in purines.  A gout attack may be triggered by trauma or stress. What increases the risk? This condition is more likely to develop in people who:  Have a family history of gout.  Are male and middle-aged.  Are male and have gone through menopause.  Are obese.  Frequently drink alcohol, especially beer.  Are dehydrated.  Lose weight too quickly.  Have an organ transplant.  Have lead poisoning.  Take certain  medicines, including aspirin, cyclosporine, diuretics, levodopa, and niacin.  Have kidney disease or psoriasis.  What are the signs or symptoms? An attack of acute gout happens quickly. It usually occurs in just one joint. The most common place is the big toe. Attacks often start at night. Other joints that may be affected include joints of the feet, ankle, knee, fingers, wrist, or elbow. Symptoms may include:  Severe pain.  Warmth.  Swelling.  Stiffness.  Tenderness. The affected joint may be very painful to touch.  Shiny, red, or purple skin.  Chills and fever.  Chronic gout may cause symptoms more frequently. More joints may be involved. You may also have white or yellow lumps (tophi) on your hands or feet or in other areas near your joints. How is this diagnosed? This condition is diagnosed based on your symptoms, medical history, and physical exam. You may have tests, such as:  Blood tests to measure uric acid levels.  Removal of joint fluid with a needle (aspiration) to look for uric acid crystals.  X-rays to look for joint damage.  How is this treated? Treatment for this condition has two phases: treating an acute attack and preventing future attacks. Acute gout treatment may include medicines to reduce pain and swelling, including:  NSAIDs.  Steroids. These are strong anti-inflammatory medicines that can be taken by mouth (orally) or injected into a joint.  Colchicine. This medicine relieves pain and swelling when it  is taken soon after an attack. It can be given orally or through an IV tube.  Preventive treatment may include:  Daily use of smaller doses of NSAIDs or colchicine.  Use of a medicine that reduces uric acid levels in your blood.  Changes to your diet. You may need to see a specialist about healthy eating (dietitian).  Follow these instructions at home: During a Gout Attack  If directed, apply ice to the affected area: ? Put ice in a plastic  bag. ? Place a towel between your skin and the bag. ? Leave the ice on for 20 minutes, 2-3 times a day.  Rest the joint as much as possible. If the affected joint is in your leg, you may be given crutches to use.  Raise (elevate) the affected joint above the level of your heart as often as possible.  Drink enough fluids to keep your urine clear or pale yellow.  Take over-the-counter and prescription medicines only as told by your health care provider.  Do not drive or operate heavy machinery while taking prescription pain medicine.  Follow instructions from your health care provider about eating or drinking restrictions.  Return to your normal activities as told by your health care provider. Ask your health care provider what activities are safe for you. Avoiding Future Gout Attacks  Follow a low-purine diet as told by your dietitian or health care provider. Avoid foods and drinks that are high in purines, including liver, kidney, anchovies, asparagus, herring, mushrooms, mussels, and beer.  Limit alcohol intake to no more than 1 drink a day for nonpregnant women and 2 drinks a day for men. One drink equals 12 oz of beer, 5 oz of wine, or 1 oz of hard liquor.  Maintain a healthy weight or lose weight if you are overweight. If you want to lose weight, talk with your health care provider. It is important that you do not lose weight too quickly.  Start or maintain an exercise program as told by your health care provider.  Drink enough fluids to keep your urine clear or pale yellow.  Take over-the-counter and prescription medicines only as told by your health care provider.  Keep all follow-up visits as told by your health care provider. This is important. Contact a health care provider if:  You have another gout attack.  You continue to have symptoms of a gout attack after10 days of treatment.  You have side effects from your medicines.  You have chills or a fever.  You have  burning pain when you urinate.  You have pain in your lower back or belly. Get help right away if:  You have severe or uncontrolled pain.  You cannot urinate. This information is not intended to replace advice given to you by your health care provider. Make sure you discuss any questions you have with your health care provider. Document Released: 03/18/2000 Document Revised: 08/27/2015 Document Reviewed: 01/01/2015 Elsevier Interactive Patient Education  2017 [employer].

## 2017-02-14 LAB — URIC ACID: Uric Acid, Serum: 6.5 mg/dL (ref 4.0–7.8)

## 2017-02-14 NOTE — Assessment & Plan Note (Signed)
Gout: This is the third episode of pain, swelling at the right foot, on clinical grounds I suspect gout.  Last time we check a uric gout was however (-) but that does not r/o gout. Extensive discussion about the DX of gout. Diet, acute treatment with colchicine prednisone and long-term treatment with allopurinol or Uloric discussed (Uloric preferred if cost is not an issue). We will check a uric acid.  For now elected not to pursue long-term treatment. Continue colchicine twice a day, start prednisone. Future acute episodes: ok self start colchicine and prednisone, call if symptoms severe or different. Call at some point if he likes to start long-term medication. This is a clinical dx so I offer  a rheumatology referral to confirm, will call if he likes to proceed.

## 2017-05-17 ENCOUNTER — Encounter: Payer: Self-pay | Admitting: Internal Medicine

## 2017-05-17 MED ORDER — VALACYCLOVIR HCL 1 G PO TABS: ORAL_TABLET | ORAL | 3 refills | Status: DC

## 2017-07-25 ENCOUNTER — Encounter: Payer: Self-pay | Admitting: Family

## 2017-07-25 ENCOUNTER — Ambulatory Visit (INDEPENDENT_AMBULATORY_CARE_PROVIDER_SITE_OTHER): Payer: 59 | Admitting: Family

## 2017-07-25 VITALS — BP 136/78 | HR 62 | Temp 98.0°F | Resp 16 | Ht 75.0 in | Wt 257.4 lb

## 2017-07-25 DIAGNOSIS — H6982 Other specified disorders of Eustachian tube, left ear: Secondary | ICD-10-CM

## 2017-07-25 NOTE — Patient Instructions (Signed)
Please add flonase 2 sprays each nostril once daily. Call if symptoms worsen or if not improved in 1 week .

## 2017-07-25 NOTE — Progress Notes (Signed)
Subjective:    Patient ID: Jon Phillips, male    DOB: 08/01/1960, 57 y.o.   MRN: [id number]  HPI  Jon Phillips is a 57 yr old male who presents today with chief complaint of left sided ear pain. Reports that symptoms started about 2 weeks ago, improved then came back.  Has some mild dizziness when laying down. Denies nasal congestion.Denies fever. Has not tried any otc remedies other than ibuprofen for his ear pain.   Review of Systems See HPI  Past Medical History:  Diagnosis Date  . Benign neoplasm of anus 2018  . Bile duct stricture 2013  . Hypertension      Social History   Socioeconomic History  . Marital status: Married    Spouse name: Not on file  . Number of children: 2  . Years of education: Not on file  . Highest education level: Not on file  Occupational History  . Occupation: Full time Social research officer, government  Social Needs  . Financial resource strain: Not on file  . Food insecurity:    Worry: Not on file    Inability: Not on file  . Transportation needs:    Medical: Not on file    Non-medical: Not on file  Tobacco Use  . Smoking status: Never Smoker  . Smokeless tobacco: Never Used  . Tobacco comment: Occasional cigars only  Substance and Sexual Activity  . Alcohol use: Yes    Alcohol/week: 1.2 oz    Types: 2 Cans of beer per week    Comment: weekends  . Drug use: No  . Sexual activity: Not on file  Lifestyle  . Physical activity:    Days per week: Not on file    Minutes per session: Not on file  . Stress: Not on file  Relationships  . Social connections:    Talks on phone: Not on file    Gets together: Not on file    Attends religious service: Not on file    Active member of club or organization: Not on file    Attends meetings of clubs or organizations: Not on file    Relationship status: Not on file  . Intimate partner violence:    Fear of current or ex partner: Not on file    Emotionally abused: Not on file    Physically abused: Not  on file    Forced sexual activity: Not on file  Other Topics Concern  . 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 BACK SURGERY    . CHOLECYSTECTOMY  1993  . ERCP  05/04/2011   Procedure: ENDOSCOPIC RETROGRADE CHOLANGIOPANCREATOGRAPHY (ERCP);  Surgeon: Jeryl Columbia, MD;  Location: [facility] ENDOSCOPY;  Service: Endoscopy;  Laterality: N/A;  fluoro  . ERCP  06/28/2011   Procedure: ENDOSCOPIC RETROGRADE CHOLANGIOPANCREATOGRAPHY (ERCP);  Surgeon: Jeryl Columbia, MD;  Location: [facility] ENDOSCOPY;  Service: Endoscopy;  Laterality: N/A;  Dr. Watt Climes to do ERCP  . ERCP  07/03/2011   Procedure: ENDOSCOPIC RETROGRADE CHOLANGIOPANCREATOGRAPHY (ERCP);  Surgeon: Wonda Horner, MD;  Location: [facility] ENDOSCOPY;  Service: Endoscopy;  Laterality: N/A;  . ERCP  09/28/2011   Procedure: ENDOSCOPIC RETROGRADE CHOLANGIOPANCREATOGRAPHY (ERCP);  Surgeon: Jeryl Columbia, MD;  Location: [facility] ENDOSCOPY;  Service: Endoscopy;  Laterality: N/A;  Brushing/ stent change/spyglass case--tradd from [place] will be here  . ERCP  11/22/2011  Procedure: ENDOSCOPIC RETROGRADE CHOLANGIOPANCREATOGRAPHY (ERCP);  Surgeon: Jeryl Columbia, MD;  Location: [facility] ENDOSCOPY;  Service: Endoscopy;  Laterality: N/A;  . EUS  06/28/2011   Procedure: LOWER ENDOSCOPIC ULTRASOUND (EUS);  Surgeon: Jeryl Columbia, MD;  Location: [facility] ENDOSCOPY;  Service: Endoscopy;  Laterality: N/A;  Nicole/Katy   . EUS  09/28/2011   Procedure: ESOPHAGEAL ENDOSCOPIC ULTRASOUND (EUS) RADIAL;  Surgeon: Jeryl Columbia, MD;  Location: [facility] ENDOSCOPY;  Service: Endoscopy;  Laterality: N/A;  . FINE NEEDLE ASPIRATION  09/28/2011   Procedure: FINE NEEDLE ASPIRATION (FNA) LINEAR;  Surgeon: Jeryl Columbia, MD;  Location: [facility] ENDOSCOPY;  Service: Endoscopy;;  . Bess Kinds CHOLANGIOSCOPY  09/28/2011   Procedure: PRXYVOPF CHOLANGIOSCOPY;  Surgeon: Jeryl Columbia, MD;  Location: [facility] ENDOSCOPY;  Service: Endoscopy;   Laterality: N/A;    Family History  Problem Relation Age of Onset  . Hypertension Mother   . Hypertension Father   . Malignant hyperthermia Neg Hx   . CAD Neg Hx   . Diabetes Neg Hx   . Colon cancer Neg Hx   . Prostate cancer Neg Hx     No Known Allergies  Current Outpatient Medications on File Prior to Visit  Medication Sig Dispense Refill  . lisinopril (PRINIVIL,ZESTRIL) 20 MG tablet Take 1 tablet (20 mg total) by mouth daily. 90 tablet 3  . MITIGARE 0.6 MG CAPS Take 1 capsule 2 (two) times daily as needed by mouth. 60 capsule 0  . valACYclovir (VALTREX) 1000 MG tablet Take a total of 2 tablets for each  episode of cold sores 20 tablet 3   No current facility-administered medications on file prior to visit.     BP 136/78 (BP Location: Right Arm, Cuff Size: Large)   Pulse 62   Temp 98 F (36.7 C) (Oral)   Resp 16   Ht 6\' 3"  (1.905 m)   Wt 257 lb 6.4 oz (116.8 kg)   SpO2 100%   BMI 32.17 kg/m       Objective:   Physical Exam  Constitutional: He is oriented to person, place, and time. He appears well-developed and well-nourished.  HENT:  Head: Normocephalic and atraumatic.  Right Ear: Tympanic membrane and ear canal normal.  Left Ear: Tympanic membrane and ear canal normal.  Musculoskeletal: He exhibits no edema.  Neurological: He is alert and oriented to person, place, and time.  Psychiatric: He has a normal mood and affect. His behavior is normal. Judgment and thought content normal.          Assessment & Plan:  Eustachian tube dysfunction- bilateral cerumen impaction was noted initially and was removed using irrigation.  Suspect symptoms secondary to left sided eustachian tube dysfunction. Recommended flonase 2 sprays each nostril once daily. Follow up if symptoms worsen or fail to improve. Pt verbalizes  Understanding.

## 2017-08-16 ENCOUNTER — Encounter: Payer: Self-pay | Admitting: Family

## 2017-09-15 ENCOUNTER — Other Ambulatory Visit: Payer: Self-pay | Admitting: Internal Medicine

## 2017-09-15 MED ORDER — MITIGARE 0.6 MG PO CAPS: 1.0000 | ORAL_CAPSULE | Freq: Two times a day (BID) | ORAL | 0 refills | Status: DC | PRN

## 2017-10-20 ENCOUNTER — Other Ambulatory Visit: Payer: Self-pay | Admitting: Internal Medicine

## 2017-10-20 MED ORDER — LISINOPRIL 20 MG PO TABS: 20.0000 mg | ORAL_TABLET | Freq: Every day | ORAL | 0 refills | Status: DC

## 2017-10-20 NOTE — Telephone Encounter (Signed)
30 day supply sent. Overdue for visit.

## 2017-10-20 NOTE — Telephone Encounter (Signed)
Copied from [place] [id number]. Topic: Quick Communication - Rx Refill/Question >> Oct 20, 2017 10:15 AM Synthia Innocent wrote: Medication: lisinopril (PRINIVIL,ZESTRIL) 20 MG tablet   Has the patient contacted their pharmacy? Yes.   (Agent: If no, request that the patient contact the pharmacy for the refill.) (Agent: If yes, when and what did the pharmacy advise?)  Preferred Pharmacy (with phone number or street name): [facility] [place]   Agent: Please be advised that RX refills may take up to 3 business days. We ask that you follow-up with your pharmacy.  Requesting call once sent

## 2017-10-20 NOTE — Telephone Encounter (Signed)
Pt called to check status on refill

## 2017-11-21 ENCOUNTER — Ambulatory Visit (INDEPENDENT_AMBULATORY_CARE_PROVIDER_SITE_OTHER): Payer: 59 | Admitting: Internal Medicine

## 2017-11-21 ENCOUNTER — Encounter: Payer: Self-pay | Admitting: Internal Medicine

## 2017-11-21 VITALS — BP 128/62 | HR 69 | Temp 97.8°F | Resp 14 | Ht 75.0 in | Wt 261.4 lb

## 2017-11-21 DIAGNOSIS — M109 Gout, unspecified: Secondary | ICD-10-CM

## 2017-11-21 DIAGNOSIS — I10 Essential (primary) hypertension: Secondary | ICD-10-CM | POA: Diagnosis not present

## 2017-11-21 HISTORY — DX: Gout, unspecified: M10.9

## 2017-11-21 MED ORDER — LISINOPRIL 20 MG PO TABS: 20.0000 mg | ORAL_TABLET | Freq: Every day | ORAL | 2 refills | Status: DC

## 2017-11-21 NOTE — Progress Notes (Signed)
Pre visit review using our clinic review tool, if applicable. No additional management support is needed unless otherwise documented below in the visit note. 

## 2017-11-21 NOTE — Assessment & Plan Note (Signed)
HTN: Continue lisinopril, RF sent, BP is very good, check a BMP Gout: Since the last visit, has changed his diet, had a single attack 5 days ago at the right thumb that responded well to colchicine, 5 doses.  Plan: Continue withprn colchicine and prednisone; cont dietary changes Dizzines: As described above, likely a peripheral issue, patient wonders if he has use taken tube dysfunction.  Symptoms are mild, for now we agreed to take Flonase consistently for 3 to 4 weeks as his TMs are slightly bulge.  Call if not better.  ENT?. RTC CPX 6 months

## 2017-11-21 NOTE — Progress Notes (Signed)
Subjective:    Patient ID: Jon Phillips, male    DOB: [birth year], 57 y.o.   MRN: [id number]  DOS:  11/21/2017 Type of visit - description : rov Interval history: Gout: Since the last visit, had a single episode @  the right thumb, area was red, swollen, tender. trigger?  Eating gravy?Marland Kitchen  HTN: Needs a refill  Continue with dizziness, was seen by another provider  few weeks ago, dizziness last few seconds when he lays down on the left. Denies nausea, headache or stroke type of symptoms.   Review of Systems See  above  Past Medical History:  Diagnosis Date  . Benign neoplasm of anus 2018  . Bile duct stricture 2013  . Gout 11/21/2017  . Hypertension     Past Surgical History:  Procedure Laterality Date  . BACK SURGERY    . CHOLECYSTECTOMY  1993  . ERCP  05/04/2011   Procedure: ENDOSCOPIC RETROGRADE CHOLANGIOPANCREATOGRAPHY (ERCP);  Surgeon: Jeryl Columbia, MD;  Location: [facility] ENDOSCOPY;  Service: Endoscopy;  Laterality: N/A;  fluoro  . ERCP  06/28/2011   Procedure: ENDOSCOPIC RETROGRADE CHOLANGIOPANCREATOGRAPHY (ERCP);  Surgeon: Jeryl Columbia, MD;  Location: [facility] ENDOSCOPY;  Service: Endoscopy;  Laterality: N/A;  Dr. Watt Climes to do ERCP  . ERCP  07/03/2011   Procedure: ENDOSCOPIC RETROGRADE CHOLANGIOPANCREATOGRAPHY (ERCP);  Surgeon: Wonda Horner, MD;  Location: [facility] ENDOSCOPY;  Service: Endoscopy;  Laterality: N/A;  . ERCP  09/28/2011   Procedure: ENDOSCOPIC RETROGRADE CHOLANGIOPANCREATOGRAPHY (ERCP);  Surgeon: Jeryl Columbia, MD;  Location: [facility] ENDOSCOPY;  Service: Endoscopy;  Laterality: N/A;  Brushing/ stent change/spyglass case--tradd from [place] will be here  . ERCP  11/22/2011   Procedure: ENDOSCOPIC RETROGRADE CHOLANGIOPANCREATOGRAPHY (ERCP);  Surgeon: Jeryl Columbia, MD;  Location: [facility] ENDOSCOPY;  Service: Endoscopy;  Laterality: N/A;  . EUS  06/28/2011   Procedure: LOWER ENDOSCOPIC ULTRASOUND (EUS);  Surgeon: Jeryl Columbia, MD;  Location: [facility] ENDOSCOPY;  Service: Endoscopy;  Laterality:  N/A;  Nicole/Katy   . EUS  09/28/2011   Procedure: ESOPHAGEAL ENDOSCOPIC ULTRASOUND (EUS) RADIAL;  Surgeon: Jeryl Columbia, MD;  Location: [facility] ENDOSCOPY;  Service: Endoscopy;  Laterality: N/A;  . FINE NEEDLE ASPIRATION  09/28/2011   Procedure: FINE NEEDLE ASPIRATION (FNA) LINEAR;  Surgeon: Jeryl Columbia, MD;  Location: [facility] ENDOSCOPY;  Service: Endoscopy;;  . Bess Kinds CHOLANGIOSCOPY  09/28/2011   Procedure: CHYIFOYD CHOLANGIOSCOPY;  Surgeon: Jeryl Columbia, MD;  Location: [facility] ENDOSCOPY;  Service: Endoscopy;  Laterality: N/A;    Social History   Socioeconomic History  . Marital status: Married    Spouse name: Not on file  . Number of children: 2  . Years of education: Not on file  . Highest education level: Not on file  Occupational History  . Occupation: Full time Social research officer, government  Social Needs  . Financial resource strain: Not on file  . Food insecurity:    Worry: Not on file    Inability: Not on file  . Transportation needs:    Medical: Not on file    Non-medical: Not on file  Tobacco Use  . Smoking status: Never Smoker  . Smokeless tobacco: Never Used  . Tobacco comment: Occasional cigars only  Substance and Sexual Activity  . Alcohol use: Yes    Alcohol/week: 2.0 standard drinks    Types: 2 Cans of beer per week    Comment: weekends  . Drug use: No  . Sexual activity: Not on file  Lifestyle  . Physical activity:  Days per week: Not on file    Minutes per session: Not on file  . Stress: Not on file  Relationships  . Social connections:    Talks on phone: Not on file    Gets together: Not on file    Attends religious service: Not on file    Active member of club or organization: Not on file    Attends meetings of clubs or organizations: Not on file    Relationship status: Not on file  . Intimate partner violence:    Fear of current or ex partner: Not on file    Emotionally abused: Not on file    Physically abused: Not on file    Forced sexual activity: Not on file    Other Topics Concern  . 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 Always use your most recent med list.          lisinopril 20 MG tablet Commonly known as:  PRINIVIL,ZESTRIL Take 1 tablet (20 mg total) by mouth daily.   MITIGARE 0.6 MG Caps Generic drug:  Colchicine Take 1 capsule by mouth 2 (two) times daily as needed.   valACYclovir 1000 MG tablet Commonly known as:  VALTREX Take a total of 2 tablets for each  episode of cold sores          Objective:   Physical Exam BP 128/62 (BP Location: Left Arm, Patient Position: Sitting, Cuff Size: Small)   Pulse 69   Temp 97.8 F (36.6 C) (Oral)   Resp 14   Ht 6\' 3"  (1.905 m)   Wt 261 lb 6 oz (118.6 kg)   SpO2 98%   BMI 32.67 kg/m   General:   Well developed, NAD, see BMI.  HEENT:  Normocephalic . Face symmetric, atraumatic.  TMs are slightly bulged but not red. Throat symmetric, nose not congested, sinuses not tender. MSK: Hands normal to inspection and palpation Neurologic:  alert & oriented X3.  Speech normal, gait appropriate for age and unassisted Psych--  Cognition and judgment appear intact.  Cooperative with normal attention span and concentration.  Behavior appropriate. No anxious or depressed appearing.      Assessment & Plan:    Assessment HTN Gout see OV note from 02/2017 Cholecystectomy, 1993 Bile duct stricture 2013  PLAN: HTN: Continue lisinopril, RF sent, BP is very good, check a BMP Gout: Since the last visit, has changed his diet, had a single attack 5 days ago at the right thumb that responded well to colchicine, 5 doses.  Plan: Continue withprn colchicine and prednisone; cont dietary changes Dizzines: As described above, likely a peripheral issue, patient wonders if he has use taken tube dysfunction.  Symptoms are  mild, for now we agreed to take Flonase consistently for 3 to 4 weeks as his TMs are slightly bulge.  Call if not better.  ENT?. RTC CPX 6 months

## 2017-11-21 NOTE — Patient Instructions (Addendum)
  GO TO THE LAB : Get the blood work     GO TO THE FRONT DESK Schedule your next appointment for a  Physical exam in 6 months   

## 2017-11-22 LAB — BASIC METABOLIC PANEL
BUN: 13 mg/dL (ref 6–23)
CO2: 24 mEq/L (ref 19–32)
CREATININE: 1.16 mg/dL (ref 0.40–1.50)
Calcium: 9.5 mg/dL (ref 8.4–10.5)
Chloride: 104 mEq/L (ref 96–112)
GFR: 68.6 mL/min (ref >60.00)
Glucose, Bld: 86 mg/dL (ref 70–99)
POTASSIUM: 3.8 meq/L (ref 3.5–5.1)
Sodium: 139 mEq/L (ref 135–145)

## 2018-05-25 ENCOUNTER — Ambulatory Visit (INDEPENDENT_AMBULATORY_CARE_PROVIDER_SITE_OTHER): Payer: 59 | Admitting: Internal Medicine

## 2018-05-25 ENCOUNTER — Encounter: Payer: Self-pay | Admitting: Internal Medicine

## 2018-05-25 ENCOUNTER — Ambulatory Visit: Payer: Self-pay | Admitting: Internal Medicine

## 2018-05-25 VITALS — BP 122/70 | HR 69 | Temp 97.8°F | Resp 16 | Ht 75.0 in | Wt 265.1 lb

## 2018-05-25 DIAGNOSIS — M109 Gout, unspecified: Secondary | ICD-10-CM

## 2018-05-25 DIAGNOSIS — Z0000 Encounter for general adult medical examination without abnormal findings: Secondary | ICD-10-CM

## 2018-05-25 DIAGNOSIS — I10 Essential (primary) hypertension: Secondary | ICD-10-CM | POA: Diagnosis not present

## 2018-05-25 MED ORDER — LISINOPRIL 20 MG PO TABS: 20.0000 mg | ORAL_TABLET | Freq: Every day | ORAL | 3 refills | Status: DC

## 2018-05-25 NOTE — Progress Notes (Signed)
Subjective:    Patient ID: Jon Phillips, male    DOB: 1960-06-29, 58 y.o.   MRN: [id number]  DOS:  05/25/2018 Type of visit - description: cpx No concerns   Review of Systems A 14 point review of systems is negative   Past Medical History:  Diagnosis Date  . Benign neoplasm of anus 2018  . Bile duct stricture 2013  . Gout 11/21/2017  . Hypertension     Past Surgical History:  Procedure Laterality Date  . BACK SURGERY    . CHOLECYSTECTOMY  1993  . ERCP  05/04/2011   Procedure: ENDOSCOPIC RETROGRADE CHOLANGIOPANCREATOGRAPHY (ERCP);  Surgeon: Jeryl Columbia, MD;  Location: [facility] ENDOSCOPY;  Service: Endoscopy;  Laterality: N/A;  fluoro  . ERCP  06/28/2011   Procedure: ENDOSCOPIC RETROGRADE CHOLANGIOPANCREATOGRAPHY (ERCP);  Surgeon: Jeryl Columbia, MD;  Location: [facility] ENDOSCOPY;  Service: Endoscopy;  Laterality: N/A;  Dr. Watt Climes to do ERCP  . ERCP  07/03/2011   Procedure: ENDOSCOPIC RETROGRADE CHOLANGIOPANCREATOGRAPHY (ERCP);  Surgeon: Wonda Horner, MD;  Location: [facility] ENDOSCOPY;  Service: Endoscopy;  Laterality: N/A;  . ERCP  09/28/2011   Procedure: ENDOSCOPIC RETROGRADE CHOLANGIOPANCREATOGRAPHY (ERCP);  Surgeon: Jeryl Columbia, MD;  Location: [facility] ENDOSCOPY;  Service: Endoscopy;  Laterality: N/A;  Brushing/ stent change/spyglass case--tradd from [place] will be here  . ERCP  11/22/2011   Procedure: ENDOSCOPIC RETROGRADE CHOLANGIOPANCREATOGRAPHY (ERCP);  Surgeon: Jeryl Columbia, MD;  Location: [facility] ENDOSCOPY;  Service: Endoscopy;  Laterality: N/A;  . EUS  06/28/2011   Procedure: LOWER ENDOSCOPIC ULTRASOUND (EUS);  Surgeon: Jeryl Columbia, MD;  Location: [facility] ENDOSCOPY;  Service: Endoscopy;  Laterality: N/A;  Nicole/Katy   . EUS  09/28/2011   Procedure: ESOPHAGEAL ENDOSCOPIC ULTRASOUND (EUS) RADIAL;  Surgeon: Jeryl Columbia, MD;  Location: [facility] ENDOSCOPY;  Service: Endoscopy;  Laterality: N/A;  . FINE NEEDLE ASPIRATION  09/28/2011   Procedure: FINE NEEDLE ASPIRATION (FNA) LINEAR;  Surgeon: Jeryl Columbia, MD;   Location: [facility] ENDOSCOPY;  Service: Endoscopy;;  . Bess Kinds CHOLANGIOSCOPY  09/28/2011   Procedure: LZJQBHAL CHOLANGIOSCOPY;  Surgeon: Jeryl Columbia, MD;  Location: [facility] ENDOSCOPY;  Service: Endoscopy;  Laterality: N/A;    Social History   Socioeconomic History  . Marital status: Married    Spouse name: Not on file  . Number of children: 2  . Years of education: Not on file  . Highest education level: Not on file  Occupational History  . Occupation: Full time Social research officer, government  Social Needs  . Financial resource strain: Not on file  . Food insecurity:    Worry: Not on file    Inability: Not on file  . Transportation needs:    Medical: Not on file    Non-medical: Not on file  Tobacco Use  . Smoking status: Never Smoker  . Smokeless tobacco: Never Used  Substance and Sexual Activity  . Alcohol use: Yes    Alcohol/week: 2.0 standard drinks    Types: 2 Cans of beer per week    Comment: weekends  . Drug use: No  . Sexual activity: Not on file  Lifestyle  . Physical activity:    Days per week: Not on file    Minutes per session: Not on file  . Stress: Not on file  Relationships  . Social connections:    Talks on phone: Not on file    Gets together: Not on file    Attends religious service: Not on file    Active member of  club or organization: Not on file    Attends meetings of clubs or organizations: Not on file    Relationship status: Not on file  . Intimate partner violence:    Fear of current or ex partner: Not on file    Emotionally abused: Not on file    Physically abused: Not on file    Forced sexual activity: Not on file  Other Topics Concern  . 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 Hypertension Mother   . Hypertension Father   . Malignant hyperthermia Neg Hx   . CAD Neg Hx   . Diabetes Neg Hx   . Colon cancer Neg Hx   . Prostate cancer Neg Hx       Allergies as of 05/25/2018   No Known Allergies     Medication List       Accurate as of May 25, 2018 11:59 PM. Always use your most recent med list.        lisinopril 20 MG tablet Commonly known as:  PRINIVIL,ZESTRIL Take 1 tablet (20 mg total) by mouth daily.   MITIGARE 0.6 MG Caps Generic drug:  Colchicine Take 1 capsule by mouth 2 (two) times daily as needed.   valACYclovir 1000 MG tablet Commonly known as:  VALTREX Take a total of 2 tablets for each  episode of cold sores           Objective:   Physical Exam BP 122/70 (BP Location: Left Arm, Patient Position: Sitting, Cuff Size: Normal)   Pulse 69   Temp 97.8 F (36.6 C) (Oral)   Resp 16   Ht 6\' 3"  (1.905 m)   Wt 265 lb 2 oz (120.3 kg)   SpO2 98%   BMI 33.14 kg/m  General: Well developed, NAD, BMI noted Neck: No  thyromegaly  HEENT:  Normocephalic . Face symmetric, atraumatic Lungs:  CTA B Normal respiratory effort, no intercostal retractions, no accessory muscle use. Heart: RRR,  no murmur.  No pretibial edema bilaterally  Abdomen:  Not distended, soft, non-tender. No rebound or rigidity.   DRE: Normal sphincter , no stools, prostate symmetric, not tender, not nodular Skin: Exposed areas without rash. Not pale. Not jaundice Neurologic:  alert & oriented X3.  Speech normal, gait appropriate for age and unassisted Strength symmetric and appropriate for age.  Psych: Cognition and judgment appear intact.  Cooperative with normal attention span and concentration.  Behavior appropriate. No anxious or depressed appearing.     Assessment     Assessment HTN Gout see OV note from 02/2017 Cholecystectomy, 1993 Bile duct stricture 2013  PLAN: HTN: Continue lisinopril, BP today is very good, EKG today NSR, no acute changes. Checking labs Gout: No recent events.  Check a uric acid RTC 1 year

## 2018-05-25 NOTE — Progress Notes (Signed)
Pre visit review using our clinic review tool, if applicable. No additional management support is needed unless otherwise documented below in the visit note. 

## 2018-05-25 NOTE — Patient Instructions (Addendum)
  GO TO THE FRONT DESK Schedule your next appointment   For a physical exam in 1 year   Schedule labs to be done next week, fasting  Your next colonoscopy is 04-2020 with Dr. Watt Climes    Check the  blood pressure   monthly  Be sure your blood pressure is between 110/65 and  135/85. If it is consistently higher or lower, let me know

## 2018-05-25 NOTE — Assessment & Plan Note (Addendum)
--  Td 2016 ; shingrix discussed, concern about cost, will d/w his insurance;  flu shot: declined. --CCS: reports he had a colonoscopy at age 58, repeat colonoscopy 04-2015, per clinic note of Dr. Watt Climes, next colonoscopy 04-2020. In 2018, I felt a polyp during the rectal exam, saw GI, issue felt to be benign.  DRE today normal. --Prostate ca screening: father was dx w/ cancer? Pt not sure; DRE wnl,  check a PSA --Labs: Will come back fasting for CMP, FLP, CBC, TSH, PSA, uric acid --Diet and exercise discussed

## 2018-05-27 NOTE — Assessment & Plan Note (Signed)
HTN: Continue lisinopril, BP today is very good, EKG today NSR, no acute changes. Checking labs Gout: No recent events.  Check a uric acid RTC 1 year

## 2018-05-30 ENCOUNTER — Other Ambulatory Visit (INDEPENDENT_AMBULATORY_CARE_PROVIDER_SITE_OTHER): Payer: 59

## 2018-05-30 DIAGNOSIS — Z125 Encounter for screening for malignant neoplasm of prostate: Secondary | ICD-10-CM

## 2018-05-30 DIAGNOSIS — Z0000 Encounter for general adult medical examination without abnormal findings: Secondary | ICD-10-CM

## 2018-05-30 DIAGNOSIS — M109 Gout, unspecified: Secondary | ICD-10-CM

## 2018-05-30 LAB — LIPID PANEL
Cholesterol: 156 mg/dL (ref 0–200)
HDL: 42 mg/dL (ref >39.00)
LDL Cholesterol: 86 mg/dL (ref 0–99)
NonHDL: 113.77
Total CHOL/HDL Ratio: 4
Triglycerides: 139 mg/dL (ref 0.0–149.0)
VLDL: 27.8 mg/dL (ref 0.0–40.0)

## 2018-05-30 LAB — CBC WITH DIFFERENTIAL/PLATELET
Basophils Absolute: 0.1 10*3/uL (ref 0.0–0.1)
Basophils Relative: 0.7 % (ref 0.0–3.0)
EOS PCT: 1.9 % (ref 0.0–5.0)
Eosinophils Absolute: 0.1 10*3/uL (ref 0.0–0.7)
HCT: 43.3 % (ref 39.0–52.0)
Hemoglobin: 15.2 g/dL (ref 13.0–17.0)
Lymphocytes Relative: 27.4 % (ref 12.0–46.0)
Lymphs Abs: 2.1 10*3/uL (ref 0.7–4.0)
MCHC: 35 g/dL (ref 30.0–36.0)
MCV: 88.8 fl (ref 78.0–100.0)
Monocytes Absolute: 0.5 10*3/uL (ref 0.1–1.0)
Monocytes Relative: 7.3 % (ref 3.0–12.0)
Neutro Abs: 4.7 10*3/uL (ref 1.4–7.7)
Neutrophils Relative %: 62.7 % (ref 43.0–77.0)
Platelets: 228 10*3/uL (ref 150.0–400.0)
RBC: 4.88 Mil/uL (ref 4.22–5.81)
RDW: 12.6 % (ref 11.5–15.5)
WBC: 7.5 10*3/uL (ref 4.0–10.5)

## 2018-05-30 LAB — COMPREHENSIVE METABOLIC PANEL
ALT: 18 U/L (ref 0–53)
AST: 19 U/L (ref 0–37)
Albumin: 4.2 g/dL (ref 3.5–5.2)
Alkaline Phosphatase: 77 U/L (ref 39–117)
BUN: 11 mg/dL (ref 6–23)
CO2: 27 mEq/L (ref 19–32)
Calcium: 9.2 mg/dL (ref 8.4–10.5)
Chloride: 105 mEq/L (ref 96–112)
Creatinine, Ser: 1 mg/dL (ref 0.40–1.50)
GFR: 76.46 mL/min (ref >60.00)
Glucose, Bld: 100 mg/dL — ABNORMAL HIGH (ref 70–99)
Potassium: 4.3 mEq/L (ref 3.5–5.1)
SODIUM: 140 meq/L (ref 135–145)
Total Bilirubin: 0.6 mg/dL (ref 0.2–1.2)
Total Protein: 7.3 g/dL (ref 6.0–8.3)

## 2018-05-30 LAB — URIC ACID: URIC ACID, SERUM: 7.7 mg/dL (ref 4.0–7.8)

## 2018-05-31 LAB — PSA: PSA: 2.25 ng/mL (ref 0.10–4.00)

## 2018-05-31 LAB — TSH: TSH: 2.61 u[IU]/mL (ref 0.35–4.50)

## 2018-07-12 ENCOUNTER — Encounter: Payer: Self-pay | Admitting: Internal Medicine

## 2019-05-28 ENCOUNTER — Ambulatory Visit (INDEPENDENT_AMBULATORY_CARE_PROVIDER_SITE_OTHER): Payer: 59 | Admitting: Internal Medicine

## 2019-05-28 ENCOUNTER — Encounter: Payer: Self-pay | Admitting: Internal Medicine

## 2019-05-28 ENCOUNTER — Other Ambulatory Visit: Payer: Self-pay

## 2019-05-28 VITALS — BP 125/66 | HR 67 | Temp 97.0°F | Resp 16 | Ht 75.0 in | Wt 250.5 lb

## 2019-05-28 DIAGNOSIS — Z0000 Encounter for general adult medical examination without abnormal findings: Secondary | ICD-10-CM | POA: Diagnosis not present

## 2019-05-28 MED ORDER — LISINOPRIL 20 MG PO TABS: 20.0000 mg | ORAL_TABLET | Freq: Every day | ORAL | 3 refills | Status: DC

## 2019-05-28 NOTE — Assessment & Plan Note (Addendum)
-   Td 2016 - shingrix discussed las year - typically doesn't get   flu shots - covid vaccine d/w pt  - CCS: reports he had a colonoscopy at age 59, repeat colonoscopy 04-2015, per clinic note of Dr. Watt Climes, next colonoscopy 04-2020. In 2018, I felt a polyp during the rectal exam, saw GI, issue felt to be benign.   --Prostate ca screening: father dx w/ prostate ca last year.  DRE wnl 2020,  check a PSA --Labs: CMP, FLP, CBC, PSA. --Diet and exercise discussed.  He is doing great, doing the stationary bike x3/ week and eating healthy, + weight loss.

## 2019-05-28 NOTE — Progress Notes (Signed)
Subjective:    Patient ID: Jon Phillips, male    DOB: [birth year], 60 y.o.   MRN: [id number]  DOS:  05/28/2019 Type of visit - description: CPX Since the last office visit he is doing great, has increase his physical activity, eating healthy, has lost weight.  Wt Readings from Last 3 Encounters:  05/28/19 250 lb 8 oz (113.6 kg)  05/25/18 265 lb 2 oz (120.3 kg)  11/21/17 261 lb 6 oz (118.6 kg)     Review of Systems  A 14 point review of systems is negative    Past Medical History:  Diagnosis Date  . Benign neoplasm of anus 2018  . Bile duct stricture 2013  . Gout 11/21/2017  . Hypertension     Past Surgical History:  Procedure Laterality Date  . BACK SURGERY    . CHOLECYSTECTOMY  1993  . ERCP  05/04/2011   Procedure: ENDOSCOPIC RETROGRADE CHOLANGIOPANCREATOGRAPHY (ERCP);  Surgeon: Jeryl Columbia, MD;  Location: [facility] ENDOSCOPY;  Service: Endoscopy;  Laterality: N/A;  fluoro  . ERCP  06/28/2011   Procedure: ENDOSCOPIC RETROGRADE CHOLANGIOPANCREATOGRAPHY (ERCP);  Surgeon: Jeryl Columbia, MD;  Location: [facility] ENDOSCOPY;  Service: Endoscopy;  Laterality: N/A;  Dr. Watt Climes to do ERCP  . ERCP  07/03/2011   Procedure: ENDOSCOPIC RETROGRADE CHOLANGIOPANCREATOGRAPHY (ERCP);  Surgeon: Wonda Horner, MD;  Location: [facility] ENDOSCOPY;  Service: Endoscopy;  Laterality: N/A;  . ERCP  09/28/2011   Procedure: ENDOSCOPIC RETROGRADE CHOLANGIOPANCREATOGRAPHY (ERCP);  Surgeon: Jeryl Columbia, MD;  Location: [facility] ENDOSCOPY;  Service: Endoscopy;  Laterality: N/A;  Brushing/ stent change/spyglass case--tradd from [place] will be here  . ERCP  11/22/2011   Procedure: ENDOSCOPIC RETROGRADE CHOLANGIOPANCREATOGRAPHY (ERCP);  Surgeon: Jeryl Columbia, MD;  Location: [facility] ENDOSCOPY;  Service: Endoscopy;  Laterality: N/A;  . EUS  06/28/2011   Procedure: LOWER ENDOSCOPIC ULTRASOUND (EUS);  Surgeon: Jeryl Columbia, MD;  Location: [facility] ENDOSCOPY;  Service: Endoscopy;  Laterality: N/A;  Nicole/Katy   . EUS  09/28/2011   Procedure:  ESOPHAGEAL ENDOSCOPIC ULTRASOUND (EUS) RADIAL;  Surgeon: Jeryl Columbia, MD;  Location: [facility] ENDOSCOPY;  Service: Endoscopy;  Laterality: N/A;  . FINE NEEDLE ASPIRATION  09/28/2011   Procedure: FINE NEEDLE ASPIRATION (FNA) LINEAR;  Surgeon: Jeryl Columbia, MD;  Location: [facility] ENDOSCOPY;  Service: Endoscopy;;  . Bess Kinds CHOLANGIOSCOPY  09/28/2011   Procedure: [id number] CHOLANGIOSCOPY;  Surgeon: Jeryl Columbia, MD;  Location: [facility] ENDOSCOPY;  Service: Endoscopy;  Laterality: N/A;    Allergies as of 05/28/2019   No Known Allergies     Medication List       Accurate as of May 28, 2019 11:59 PM. If you have any questions, ask your nurse or doctor.        lisinopril 20 MG tablet Commonly known as: ZESTRIL Take 1 tablet (20 mg total) by mouth daily.   Mitigare 0.6 MG Caps Generic drug: Colchicine Take 1 capsule by mouth 2 (two) times daily as needed.   valACYclovir 1000 MG tablet Commonly known as: VALTREX Take a total of 2 tablets for each  episode of cold sores         Family History  Problem Relation Age of Onset  . Hypertension Mother   . Hypertension Father   . Prostate cancer Father   . Malignant hyperthermia Neg Hx   . CAD Neg Hx   . Diabetes Neg Hx   . Colon cancer Neg Hx      Objective:   Physical Exam BP  125/66 (BP Location: Left Arm, Patient Position: Sitting, Cuff Size: Normal)   Pulse 67   Temp (!) 97 F (36.1 C) (Temporal)   Resp 16   Ht 6\' 3"  (1.905 m)   Wt 250 lb 8 oz (113.6 kg)   SpO2 100%   BMI 31.31 kg/m  General: Well developed, NAD, BMI noted Neck: No  thyromegaly  HEENT:  Normocephalic . Face symmetric, atraumatic Lungs:  CTA B Normal respiratory effort, no intercostal retractions, no accessory muscle use. Heart: RRR,  no murmur.  Abdomen:  Not distended, soft, non-tender. No rebound or rigidity.   Lower extremities: no pretibial edema bilaterally  Skin: Exposed areas without rash. Not pale. Not jaundice Neurologic:  alert & oriented X3.    Speech normal, gait appropriate for age and unassisted Strength symmetric and appropriate for age.  Psych: Cognition and judgment appear intact.  Cooperative with normal attention span and concentration.  Behavior appropriate. No anxious or depressed appearing.     Assessment     Assessment HTN Gout see OV note from 02/2017 Cholecystectomy, 1993 Bile duct stricture 2013  PLAN: Here for CPX HTN: On lisinopril, RF sent, BP today is great, recommend ambulatory BPs.  Checking labs Gout: No recent episodes, will call when colchicine refills needed RTC for labs within few days RTC for CPX 1 year  This visit occurred during the SARS-CoV-2 public health emergency.  Safety protocols were in place, including screening questions prior to the visit, additional usage of staff PPE, and extensive cleaning of exam room while observing appropriate contact time as indicated for disinfecting solutions.

## 2019-05-28 NOTE — Patient Instructions (Addendum)
   GO TO THE FRONT DESK Come back for a physical in 1 year, please make an appointment  Come back for blood work only within few days , please make an appointment    Check the  blood pressure every 2 or 3 months  BP GOAL is between 110/65 and  135/85. If it is consistently higher or lower, let me know

## 2019-05-28 NOTE — Progress Notes (Signed)
Pre visit review using our clinic review tool, if applicable. No additional management support is needed unless otherwise documented below in the visit note. 

## 2019-05-30 NOTE — Assessment & Plan Note (Signed)
Here for CPX HTN: On lisinopril, RF sent, BP today is great, recommend ambulatory BPs.  Checking labs Gout: No recent episodes, will call when colchicine refills needed RTC for labs within few days RTC for CPX 1 year

## 2019-06-05 ENCOUNTER — Other Ambulatory Visit (INDEPENDENT_AMBULATORY_CARE_PROVIDER_SITE_OTHER): Payer: 59

## 2019-06-05 ENCOUNTER — Other Ambulatory Visit: Payer: Self-pay

## 2019-06-05 DIAGNOSIS — Z0000 Encounter for general adult medical examination without abnormal findings: Secondary | ICD-10-CM | POA: Diagnosis not present

## 2019-06-05 LAB — LIPID PANEL
Cholesterol: 157 mg/dL (ref 0–200)
HDL: 44.1 mg/dL (ref >39.00)
LDL Cholesterol: 89 mg/dL (ref 0–99)
NonHDL: 112.96
Total CHOL/HDL Ratio: 4
Triglycerides: 118 mg/dL (ref 0.0–149.0)
VLDL: 23.6 mg/dL (ref 0.0–40.0)

## 2019-06-05 LAB — CBC WITH DIFFERENTIAL/PLATELET
Basophils Absolute: 0.1 10*3/uL (ref 0.0–0.1)
Basophils Relative: 0.8 % (ref 0.0–3.0)
Eosinophils Absolute: 0.2 10*3/uL (ref 0.0–0.7)
Eosinophils Relative: 3.3 % (ref 0.0–5.0)
HCT: 42.9 % (ref 39.0–52.0)
Hemoglobin: 15 g/dL (ref 13.0–17.0)
Lymphocytes Relative: 25.4 % (ref 12.0–46.0)
Lymphs Abs: 1.9 10*3/uL (ref 0.7–4.0)
MCHC: 34.9 g/dL (ref 30.0–36.0)
MCV: 90.1 fl (ref 78.0–100.0)
Monocytes Absolute: 0.5 10*3/uL (ref 0.1–1.0)
Monocytes Relative: 7.1 % (ref 3.0–12.0)
Neutro Abs: 4.6 10*3/uL (ref 1.4–7.7)
Neutrophils Relative %: 63.4 % (ref 43.0–77.0)
Platelets: 218 10*3/uL (ref 150.0–400.0)
RBC: 4.76 Mil/uL (ref 4.22–5.81)
RDW: 12.6 % (ref 11.5–15.5)
WBC: 7.3 10*3/uL (ref 4.0–10.5)

## 2019-06-05 LAB — COMPREHENSIVE METABOLIC PANEL
ALT: 14 U/L (ref 0–53)
AST: 17 U/L (ref 0–37)
Albumin: 4 g/dL (ref 3.5–5.2)
Alkaline Phosphatase: 80 U/L (ref 39–117)
BUN: 13 mg/dL (ref 6–23)
CO2: 28 mEq/L (ref 19–32)
Calcium: 9.3 mg/dL (ref 8.4–10.5)
Chloride: 105 mEq/L (ref 96–112)
Creatinine, Ser: 1 mg/dL (ref 0.40–1.50)
GFR: 76.2 mL/min (ref >60.00)
Glucose, Bld: 96 mg/dL (ref 70–99)
Potassium: 4.3 mEq/L (ref 3.5–5.1)
Sodium: 140 mEq/L (ref 135–145)
Total Bilirubin: 0.5 mg/dL (ref 0.2–1.2)
Total Protein: 6.9 g/dL (ref 6.0–8.3)

## 2019-06-05 LAB — PSA: PSA: 1.85 ng/mL (ref 0.10–4.00)

## 2019-08-19 ENCOUNTER — Encounter: Payer: Self-pay | Admitting: Internal Medicine

## 2019-08-19 MED ORDER — VALACYCLOVIR HCL 1 G PO TABS: ORAL_TABLET | ORAL | 3 refills | Status: DC

## 2019-08-19 MED ORDER — MITIGARE 0.6 MG PO CAPS: 1.0000 | ORAL_CAPSULE | Freq: Two times a day (BID) | ORAL | 0 refills | Status: DC | PRN

## 2020-06-11 ENCOUNTER — Encounter: Payer: Self-pay | Admitting: Internal Medicine

## 2020-09-28 ENCOUNTER — Ambulatory Visit (INDEPENDENT_AMBULATORY_CARE_PROVIDER_SITE_OTHER): Payer: BC Managed Care – PPO | Admitting: Internal Medicine

## 2020-09-28 ENCOUNTER — Encounter: Payer: Self-pay | Admitting: Internal Medicine

## 2020-09-28 ENCOUNTER — Other Ambulatory Visit: Payer: Self-pay

## 2020-09-28 VITALS — BP 144/72 | HR 75 | Temp 98.2°F | Resp 16 | Ht 75.0 in | Wt 247.0 lb

## 2020-09-28 DIAGNOSIS — Z0000 Encounter for general adult medical examination without abnormal findings: Secondary | ICD-10-CM | POA: Diagnosis not present

## 2020-09-28 DIAGNOSIS — I10 Essential (primary) hypertension: Secondary | ICD-10-CM

## 2020-09-28 MED ORDER — LISINOPRIL 20 MG PO TABS: 20.0000 mg | ORAL_TABLET | Freq: Every day | ORAL | 3 refills | Status: DC

## 2020-09-28 NOTE — Patient Instructions (Addendum)
Check the  blood pressure 2 times a month   Be sure your blood pressure is between 110/65 and  145/85.  if it is consistently higher or lower, let me know  To find a good quality blood pressure cuff:  [email or web address]     GO TO THE FRONT DESK, PLEASE SCHEDULE YOUR APPOINTMENTS Come back for  labs    Come back for a physical exam in 1 year   Low-Sodium Eating Plan Sodium, which is an element that makes up salt, helps you maintain a healthy balance of fluids in your body. Too much sodium can increase your bloodpressure and cause fluid and waste to be held in your body. Your health care provider or dietitian may recommend following this plan if you have high blood pressure (hypertension), kidney disease, liver disease, or heart failure. Eating less sodium can help lower your blood pressure, reduce swelling, and protect your heart, liver, andkidneys. What are tips for following this plan? Reading food labels The Nutrition Facts label lists the amount of sodium in one serving of the food. If you eat more than one serving, you must multiply the listed amount of sodium by the number of servings. Choose foods with less than 140 mg of sodium per serving. Avoid foods with 300 mg of sodium or more per serving. Shopping  Look for lower-sodium products, often labeled as "low-sodium" or "no salt added." Always check the sodium content, even if foods are labeled as "unsalted" or "no salt added." Buy fresh foods. Avoid canned foods and pre-made or frozen meals. Avoid canned, cured, or processed meats. Buy breads that have less than 80 mg of sodium per slice.  Cooking  Eat more home-cooked food and less restaurant, buffet, and fast food. Avoid adding salt when cooking. Use salt-free seasonings or herbs instead of table salt or sea salt. Check with your health care provider or pharmacist before using salt substitutes. Cook with plant-based oils, such as canola, sunflower, or olive oil.  Meal  planning When eating at a restaurant, ask that your food be prepared with less salt or no salt, if possible. Avoid dishes labeled as brined, pickled, cured, smoked, or made with soy sauce, miso, or teriyaki sauce. Avoid foods that contain MSG (monosodium glutamate). MSG is sometimes added to [place] food, bouillon, and some canned foods. Make meals that can be grilled, baked, poached, roasted, or steamed. These are generally made with less sodium. General information Most people on this plan should limit their sodium intake to 1,500-2,000 mg (milligrams) of sodium each day. What foods should I eat? Fruits Fresh, frozen, or canned fruit. Fruit juice. Vegetables Fresh or frozen vegetables. "No salt added" canned vegetables. "No salt added"tomato sauce and paste. Low-sodium or reduced-sodium tomato and vegetable juice. Grains Low-sodium cereals, including oats, puffed wheat and rice, and shredded wheat. Low-sodium crackers. Unsalted rice. Unsalted pasta. Low-sodium bread.Whole-grain breads and whole-grain pasta. Meats and other proteins Fresh or frozen (no salt added) meat, poultry, seafood, and fish. Low-sodium canned tuna and salmon. Unsalted nuts. Dried peas, beans, and lentils withoutadded salt. Unsalted canned beans. Eggs. Unsalted nut butters. Dairy Milk. Soy milk. Cheese that is naturally low in sodium, such as ricotta cheese, fresh mozzarella, or Swiss cheese. Low-sodium or reduced-sodium cheese. Creamcheese. Yogurt. Seasonings and condiments Fresh and dried herbs and spices. Salt-free seasonings. Low-sodium mustard and ketchup. Sodium-free salad dressing. Sodium-free light mayonnaise. Fresh orrefrigerated horseradish. Lemon juice. Vinegar. Other foods Homemade, reduced-sodium, or low-sodium soups. Unsalted popcorn and pretzels.Low-salt or salt-free chips. The  items listed above may not be a complete list of foods and beverages you can eat. Contact a dietitian for more information. What  foods should I avoid? Vegetables Sauerkraut, pickled vegetables, and relishes. Olives. [place] fries. Onion rings. Regular canned vegetables (not low-sodium or reduced-sodium). Regular canned tomato sauce and paste (not low-sodium or reduced-sodium). Regular tomato and vegetable juice (not low-sodium or reduced-sodium). Frozenvegetables in sauces. Grains Instant hot cereals. Bread stuffing, pancake, and biscuit mixes. Croutons. Seasoned rice or pasta mixes. Noodle soup cups. Boxed or frozen macaroni andcheese. Regular salted crackers. Self-rising flour. Meats and other proteins Meat or fish that is salted, canned, smoked, spiced, or pickled. Precooked or cured meat, such as sausages or meat loaves. Berniece Salines. Ham. Pepperoni. Hot dogs. Corned beef. Chipped beef. Salt pork. Jerky. Pickled herring. Anchovies andsardines. Regular canned tuna. Salted nuts. Dairy Processed cheese and cheese spreads. Hard cheeses. Cheese curds. Blue cheese.Feta cheese. String cheese. Regular cottage cheese. Buttermilk. Canned milk. Fats and oils Salted butter. Regular margarine. Ghee. Bacon fat. Seasonings and condiments Onion salt, garlic salt, seasoned salt, table salt, and sea salt. Canned and packaged gravies. Worcestershire sauce. Tartar sauce. Barbecue sauce. Teriyaki sauce. Soy sauce, including reduced-sodium. Steak sauce. Fish sauce. Oyster sauce. Cocktail sauce. Horseradish that you find on the shelf. Regular ketchup and mustard. Meat flavorings and tenderizers. Bouillon cubes. Hot sauce. Pre-made or packaged marinades. Pre-made or packaged taco seasonings. Relishes.Regular salad dressings. Salsa. Other foods Salted popcorn and pretzels. Corn chips and puffs. Potato and tortilla chips.Canned or dried soups. Pizza. Frozen entrees and pot pies. The items listed above may not be a complete list of foods and beverages you should avoid. Contact a dietitian for more information. Summary Eating less sodium can help lower your  blood pressure, reduce swelling, and protect your heart, liver, and kidneys. Most people on this plan should limit their sodium intake to 1,500-2,000 mg (milligrams) of sodium each day. Canned, boxed, and frozen foods are high in sodium. Restaurant foods, fast foods, and pizza are also very high in sodium. You also get sodium by adding salt to food. Try to cook at home, eat more fresh fruits and vegetables, and eat less fast food and canned, processed, or prepared foods. This information is not intended to replace advice given to you by your health care provider. Make sure you discuss any questions you have with your healthcare provider. Document Revised: 04/26/2019 Document Reviewed: 02/20/2019 Elsevier Patient Education  2022 [employer].

## 2020-09-28 NOTE — Progress Notes (Signed)
Subjective:    Patient ID: Jon Phillips, male    DOB: Jan 28, 1961, 60 y.o.   MRN: [id number]  DOS:  09/28/2020 Type of visit - description: CPX  No concerns except for occ L ear pressure w/o other sxs    BP Readings from Last 3 Encounters:  09/28/20 (!) 144/72  05/28/19 125/66  05/25/18 122/70    Review of Systems  Other than above, a 14 point review of systems is negative    Past Medical History:  Diagnosis Date   Benign neoplasm of anus 2018   Bile duct stricture 2013   Gout 11/21/2017   Hypertension     Past Surgical History:  Procedure Laterality Date   [place]   ERCP  05/04/2011   Procedure: ENDOSCOPIC RETROGRADE CHOLANGIOPANCREATOGRAPHY (ERCP);  Surgeon: Jeryl Columbia, MD;  Location: [facility] ENDOSCOPY;  Service: Endoscopy;  Laterality: N/A;  fluoro   ERCP  06/28/2011   Procedure: ENDOSCOPIC RETROGRADE CHOLANGIOPANCREATOGRAPHY (ERCP);  Surgeon: Jeryl Columbia, MD;  Location: [facility] ENDOSCOPY;  Service: Endoscopy;  Laterality: N/A;  Dr. Watt Climes to do ERCP   ERCP  07/03/2011   Procedure: ENDOSCOPIC RETROGRADE CHOLANGIOPANCREATOGRAPHY (ERCP);  Surgeon: Wonda Horner, MD;  Location: [facility] ENDOSCOPY;  Service: Endoscopy;  Laterality: N/A;   ERCP  09/28/2011   Procedure: ENDOSCOPIC RETROGRADE CHOLANGIOPANCREATOGRAPHY (ERCP);  Surgeon: Jeryl Columbia, MD;  Location: [facility] ENDOSCOPY;  Service: Endoscopy;  Laterality: N/A;  Brushing/ stent change/spyglass case--tradd from [place] will be here   ERCP  11/22/2011   Procedure: ENDOSCOPIC RETROGRADE CHOLANGIOPANCREATOGRAPHY (ERCP);  Surgeon: Jeryl Columbia, MD;  Location: [facility] ENDOSCOPY;  Service: Endoscopy;  Laterality: N/A;   EUS  06/28/2011   Procedure: LOWER ENDOSCOPIC ULTRASOUND (EUS);  Surgeon: Jeryl Columbia, MD;  Location: [facility] ENDOSCOPY;  Service: Endoscopy;  Laterality: N/A;  Nicole/Katy    EUS  09/28/2011   Procedure: ESOPHAGEAL ENDOSCOPIC ULTRASOUND (EUS) RADIAL;  Surgeon: Jeryl Columbia, MD;  Location: [facility] ENDOSCOPY;   Service: Endoscopy;  Laterality: N/A;   FINE NEEDLE ASPIRATION  09/28/2011   Procedure: FINE NEEDLE ASPIRATION (FNA) LINEAR;  Surgeon: Jeryl Columbia, MD;  Location: [facility] ENDOSCOPY;  Service: Endoscopy;;   SPYGLASS CHOLANGIOSCOPY  09/28/2011   Procedure: WYOVZCHY CHOLANGIOSCOPY;  Surgeon: Jeryl Columbia, MD;  Location: [facility] ENDOSCOPY;  Service: Endoscopy;  Laterality: N/A;   Social History   Socioeconomic History   Marital status: Married    Spouse name: Not on file   Number of children: 2   Years of education: Not on file   Highest education level: Not on file  Occupational History   Occupation: Full time - Sales promotion account executive  Tobacco Use   Smoking status: Never   Smokeless tobacco: Never  Substance and Sexual Activity   Alcohol use: Yes    Alcohol/week: 2.0 standard drinks    Types: 2 Cans of beer per week    Comment: weekends   Drug use: No   Sexual activity: Not on file  Other Topics Concern   Not on file  Social History Narrative   Son '93 airplane pilot  , daughter '97 ( psychology )   Social Determinants of Radio broadcast assistant Strain: Not on file  Food Insecurity: Not on file  Transportation Needs: Not on file  Physical Activity: Not on file  Stress: Not on file  Social Connections: Not on file  Intimate Partner Violence: Not on file     Allergies as of  09/28/2020   No Known Allergies      Medication List        Accurate as of September 28, 2020 11:59 PM. If you have any questions, ask your nurse or doctor.          lisinopril 20 MG tablet Commonly known as: ZESTRIL Take 1 tablet (20 mg total) by mouth daily.   Mitigare 0.6 MG Caps Generic drug: Colchicine Take 1 capsule by mouth 2 (two) times daily as needed.   valACYclovir 1000 MG tablet Commonly known as: VALTREX Take a total of 2 tablets for each  episode of cold sores           Objective:   Physical Exam BP (!) 144/72 (BP Location: Left Arm, Patient Position: Sitting, Cuff Size: Normal)    Pulse 75   Temp 98.2 F (36.8 C) (Oral)   Resp 16   Ht 6\' 3"  (1.905 m)   Wt 247 lb (112 kg)   SpO2 98%   BMI 30.87 kg/m  General: Well developed, NAD, BMI noted Neck: No  thyromegaly  HEENT:  Normocephalic . Face symmetric, atraumatic.  Ears: TMs normal, some dry wax noted. Lungs:  CTA B Normal respiratory effort, no intercostal retractions, no accessory muscle use. Heart: RRR,  no murmur.  Abdomen:  Not distended, soft, non-tender. No rebound or rigidity.   Lower extremities: no pretibial edema bilaterally DRE: Normal sphincter tone, no stool, no rectal mass, prostate normal Skin: Exposed areas without rash. Not pale. Not jaundice Neurologic:  alert & oriented X3.  Speech normal, gait appropriate for age and unassisted Strength symmetric and appropriate for age.  Psych: Cognition and judgment appear intact.  Cooperative with normal attention span and concentration.  Behavior appropriate. No anxious or depressed appearing.     Assessment    ASSESSMENT HTN Gout see OV note from 02/2017 Cholecystectomy, 1993 Bile duct stricture 2013  PLAN: Here for CPX HTN: On lisinopril, Rx sent, recommend to monitor BPs. History of gout: No recent events RTC for fasting lab at his earliest convenience RTC CPX 1 year.       This visit occurred during the SARS-CoV-2 public health emergency.  Safety protocols were in place, including screening questions prior to the visit, additional usage of staff PPE, and extensive cleaning of exam room while observing appropriate contact time as indicated for disinfecting solutions.

## 2020-09-29 ENCOUNTER — Encounter: Payer: Self-pay | Admitting: Internal Medicine

## 2020-09-29 NOTE — Assessment & Plan Note (Signed)
Here for CPX HTN: On lisinopril, Rx sent, recommend to monitor BPs. History of gout: No recent events RTC for fasting lab at his earliest convenience RTC CPX 1 year.

## 2020-09-29 NOTE — Assessment & Plan Note (Signed)
-   Td 2016 - shingrix ,  flu shots, covid vaccine d/w pt before, declined further conversation - CCS: reports he had a colonoscopy at age 60, repeat colonoscopy 04-2015, per clinic note of Dr. Watt Climes, next colonoscopy 04-2020, pt got a letter, recommend to call them --Prostate ca screening: pt's father dx w/ prostate ca last year.  DRE normal today, check a PSA --Labs: CMP, FLP, CBC, PSA. --Diet and exercise discussed.  Doing well, uses the treadmill bike 3 times a week.  Active, he is a [employer] cabin crew.  Doing okay with diet.Marland Kitchen

## 2020-09-30 ENCOUNTER — Other Ambulatory Visit: Payer: Self-pay

## 2020-09-30 ENCOUNTER — Other Ambulatory Visit (INDEPENDENT_AMBULATORY_CARE_PROVIDER_SITE_OTHER): Payer: BC Managed Care – PPO

## 2020-09-30 DIAGNOSIS — Z0000 Encounter for general adult medical examination without abnormal findings: Secondary | ICD-10-CM

## 2020-09-30 LAB — CBC WITH DIFFERENTIAL/PLATELET
Basophils Absolute: 0 10*3/uL (ref 0.0–0.1)
Basophils Relative: 0.7 % (ref 0.0–3.0)
Eosinophils Absolute: 0.1 10*3/uL (ref 0.0–0.7)
Eosinophils Relative: 2 % (ref 0.0–5.0)
HCT: 42.4 % (ref 39.0–52.0)
Hemoglobin: 14.7 g/dL (ref 13.0–17.0)
Lymphocytes Relative: 26.3 % (ref 12.0–46.0)
Lymphs Abs: 1.8 10*3/uL (ref 0.7–4.0)
MCHC: 34.6 g/dL (ref 30.0–36.0)
MCV: 90.7 fl (ref 78.0–100.0)
Monocytes Absolute: 0.5 10*3/uL (ref 0.1–1.0)
Monocytes Relative: 6.8 % (ref 3.0–12.0)
Neutro Abs: 4.4 10*3/uL (ref 1.4–7.7)
Neutrophils Relative %: 64.2 % (ref 43.0–77.0)
Platelets: 224 10*3/uL (ref 150.0–400.0)
RBC: 4.68 Mil/uL (ref 4.22–5.81)
RDW: 12.8 % (ref 11.5–15.5)
WBC: 6.8 10*3/uL (ref 4.0–10.5)

## 2020-09-30 LAB — COMPREHENSIVE METABOLIC PANEL
ALT: 17 U/L (ref 0–53)
AST: 19 U/L (ref 0–37)
Albumin: 4.3 g/dL (ref 3.5–5.2)
Alkaline Phosphatase: 88 U/L (ref 39–117)
BUN: 12 mg/dL (ref 6–23)
CO2: 29 mEq/L (ref 19–32)
Calcium: 9.2 mg/dL (ref 8.4–10.5)
Chloride: 104 mEq/L (ref 96–112)
Creatinine, Ser: 0.93 mg/dL (ref 0.40–1.50)
GFR: 88.69 mL/min (ref >60.00)
Glucose, Bld: 99 mg/dL (ref 70–99)
Potassium: 4.5 mEq/L (ref 3.5–5.1)
Sodium: 140 mEq/L (ref 135–145)
Total Bilirubin: 0.5 mg/dL (ref 0.2–1.2)
Total Protein: 7.2 g/dL (ref 6.0–8.3)

## 2020-09-30 LAB — LIPID PANEL
Cholesterol: 163 mg/dL (ref 0–200)
HDL: 42.8 mg/dL (ref >39.00)
LDL Cholesterol: 93 mg/dL (ref 0–99)
NonHDL: 119.83
Total CHOL/HDL Ratio: 4
Triglycerides: 136 mg/dL (ref 0.0–149.0)
VLDL: 27.2 mg/dL (ref 0.0–40.0)

## 2020-09-30 LAB — PSA: PSA: 2.37 ng/mL (ref 0.10–4.00)

## 2021-01-12 ENCOUNTER — Encounter: Payer: Self-pay | Admitting: Internal Medicine

## 2021-01-12 MED ORDER — VALACYCLOVIR HCL 1 G PO TABS: ORAL_TABLET | ORAL | 3 refills | Status: DC

## 2021-01-28 DIAGNOSIS — H40013 Open angle with borderline findings, low risk, bilateral: Secondary | ICD-10-CM | POA: Diagnosis not present

## 2021-03-11 DIAGNOSIS — D125 Benign neoplasm of sigmoid colon: Secondary | ICD-10-CM | POA: Diagnosis not present

## 2021-03-11 DIAGNOSIS — K5730 Diverticulosis of large intestine without perforation or abscess without bleeding: Secondary | ICD-10-CM | POA: Diagnosis not present

## 2021-03-11 DIAGNOSIS — K649 Unspecified hemorrhoids: Secondary | ICD-10-CM | POA: Diagnosis not present

## 2021-03-11 DIAGNOSIS — Z86010 Personal history of colonic polyps: Secondary | ICD-10-CM | POA: Diagnosis not present

## 2021-03-11 DIAGNOSIS — D122 Benign neoplasm of ascending colon: Secondary | ICD-10-CM | POA: Diagnosis not present

## 2021-03-11 DIAGNOSIS — D128 Benign neoplasm of rectum: Secondary | ICD-10-CM | POA: Diagnosis not present

## 2021-03-11 LAB — HM COLONOSCOPY

## 2021-03-16 DIAGNOSIS — C44629 Squamous cell carcinoma of skin of left upper limb, including shoulder: Secondary | ICD-10-CM | POA: Diagnosis not present

## 2021-03-16 DIAGNOSIS — D225 Melanocytic nevi of trunk: Secondary | ICD-10-CM | POA: Diagnosis not present

## 2021-03-16 DIAGNOSIS — D485 Neoplasm of uncertain behavior of skin: Secondary | ICD-10-CM | POA: Diagnosis not present

## 2021-03-16 DIAGNOSIS — L570 Actinic keratosis: Secondary | ICD-10-CM | POA: Diagnosis not present

## 2021-03-16 DIAGNOSIS — C44622 Squamous cell carcinoma of skin of right upper limb, including shoulder: Secondary | ICD-10-CM | POA: Diagnosis not present

## 2021-03-16 DIAGNOSIS — L814 Other melanin hyperpigmentation: Secondary | ICD-10-CM | POA: Diagnosis not present

## 2021-03-16 DIAGNOSIS — L821 Other seborrheic keratosis: Secondary | ICD-10-CM | POA: Diagnosis not present

## 2021-06-15 DIAGNOSIS — Z86007 Personal history of in-situ neoplasm of skin: Secondary | ICD-10-CM | POA: Diagnosis not present

## 2021-06-15 DIAGNOSIS — L905 Scar conditions and fibrosis of skin: Secondary | ICD-10-CM | POA: Diagnosis not present

## 2021-06-15 DIAGNOSIS — L570 Actinic keratosis: Secondary | ICD-10-CM | POA: Diagnosis not present

## 2021-06-15 DIAGNOSIS — Z09 Encounter for follow-up examination after completed treatment for conditions other than malignant neoplasm: Secondary | ICD-10-CM | POA: Diagnosis not present

## 2021-06-18 ENCOUNTER — Encounter: Payer: Self-pay | Admitting: Internal Medicine

## 2021-06-29 ENCOUNTER — Encounter: Payer: Self-pay | Admitting: Internal Medicine

## 2021-09-09 ENCOUNTER — Encounter: Payer: Self-pay | Admitting: Internal Medicine

## 2021-09-09 DIAGNOSIS — M109 Gout, unspecified: Secondary | ICD-10-CM

## 2021-09-09 DIAGNOSIS — I10 Essential (primary) hypertension: Secondary | ICD-10-CM

## 2021-09-09 DIAGNOSIS — Z0000 Encounter for general adult medical examination without abnormal findings: Secondary | ICD-10-CM

## 2021-09-09 NOTE — Telephone Encounter (Signed)
Arrange labs for few days before the physical.  CMP, FLP, CBC, TSH, PSA, uric acid DX HTN, gout, CPX

## 2021-09-09 NOTE — Addendum Note (Signed)
Addended byDamita Dunnings D on: 09/09/2021 01:05 PM   Modules accepted: Orders

## 2021-09-30 ENCOUNTER — Encounter: Payer: Self-pay | Admitting: Internal Medicine

## 2021-09-30 ENCOUNTER — Ambulatory Visit (INDEPENDENT_AMBULATORY_CARE_PROVIDER_SITE_OTHER): Payer: BC Managed Care – PPO | Admitting: Internal Medicine

## 2021-09-30 VITALS — BP 126/70 | HR 65 | Temp 98.1°F | Resp 16 | Ht 75.0 in | Wt 251.0 lb

## 2021-09-30 DIAGNOSIS — M109 Gout, unspecified: Secondary | ICD-10-CM | POA: Diagnosis not present

## 2021-09-30 DIAGNOSIS — I10 Essential (primary) hypertension: Secondary | ICD-10-CM | POA: Diagnosis not present

## 2021-09-30 DIAGNOSIS — Z0000 Encounter for general adult medical examination without abnormal findings: Secondary | ICD-10-CM | POA: Diagnosis not present

## 2021-09-30 LAB — CBC WITH DIFFERENTIAL/PLATELET
Basophils Absolute: 0 10*3/uL (ref 0.0–0.1)
Basophils Relative: 0.6 % (ref 0.0–3.0)
Eosinophils Absolute: 0.1 10*3/uL (ref 0.0–0.7)
Eosinophils Relative: 1.3 % (ref 0.0–5.0)
HCT: 43.3 % (ref 39.0–52.0)
Hemoglobin: 14.7 g/dL (ref 13.0–17.0)
Lymphocytes Relative: 23.6 % (ref 12.0–46.0)
Lymphs Abs: 1.8 10*3/uL (ref 0.7–4.0)
MCHC: 34 g/dL (ref 30.0–36.0)
MCV: 91 fl (ref 78.0–100.0)
Monocytes Absolute: 0.6 10*3/uL (ref 0.1–1.0)
Monocytes Relative: 7.8 % (ref 3.0–12.0)
Neutro Abs: 5.2 10*3/uL (ref 1.4–7.7)
Neutrophils Relative %: 66.7 % (ref 43.0–77.0)
Platelets: 231 10*3/uL (ref 150.0–400.0)
RBC: 4.76 Mil/uL (ref 4.22–5.81)
RDW: 12.9 % (ref 11.5–15.5)
WBC: 7.8 10*3/uL (ref 4.0–10.5)

## 2021-09-30 LAB — URIC ACID: Uric Acid, Serum: 6.4 mg/dL (ref 4.0–7.8)

## 2021-09-30 LAB — COMPREHENSIVE METABOLIC PANEL
ALT: 19 U/L (ref 0–53)
AST: 20 U/L (ref 0–37)
Albumin: 4.2 g/dL (ref 3.5–5.2)
Alkaline Phosphatase: 88 U/L (ref 39–117)
BUN: 14 mg/dL (ref 6–23)
CO2: 27 mEq/L (ref 19–32)
Calcium: 9.2 mg/dL (ref 8.4–10.5)
Chloride: 104 mEq/L (ref 96–112)
Creatinine, Ser: 0.97 mg/dL (ref 0.40–1.50)
GFR: 83.73 mL/min (ref >60.00)
Glucose, Bld: 94 mg/dL (ref 70–99)
Potassium: 4.1 mEq/L (ref 3.5–5.1)
Sodium: 139 mEq/L (ref 135–145)
Total Bilirubin: 0.4 mg/dL (ref 0.2–1.2)
Total Protein: 7.1 g/dL (ref 6.0–8.3)

## 2021-09-30 LAB — TSH: TSH: 2.15 u[IU]/mL (ref 0.35–5.50)

## 2021-09-30 LAB — PSA: PSA: 2.34 ng/mL (ref 0.10–4.00)

## 2021-09-30 LAB — LIPID PANEL
Cholesterol: 154 mg/dL (ref 0–200)
HDL: 40.9 mg/dL (ref >39.00)
LDL Cholesterol: 85 mg/dL (ref 0–99)
NonHDL: 113.02
Total CHOL/HDL Ratio: 4
Triglycerides: 138 mg/dL (ref 0.0–149.0)
VLDL: 27.6 mg/dL (ref 0.0–40.0)

## 2021-09-30 MED ORDER — MITIGARE 0.6 MG PO CAPS: 1.0000 | ORAL_CAPSULE | Freq: Two times a day (BID) | ORAL | 2 refills | Status: DC | PRN

## 2021-09-30 MED ORDER — LISINOPRIL 20 MG PO TABS: 20.0000 mg | ORAL_TABLET | Freq: Every day | ORAL | 3 refills | Status: DC

## 2021-09-30 NOTE — Assessment & Plan Note (Signed)
Here for CPX HTN: BP is great, continue Lisinopril, checking labs. Gout: Checking uric acid. L hip pain: PT versus Ortho referral versus Motrin.  We agreed to take Motrin with GI precautions, he will call if not better for further advice.  Encouraged to go back to the recumbent bike. RTC 1 year

## 2021-09-30 NOTE — Progress Notes (Signed)
Subjective:    Patient ID: Jon Phillips, male    DOB: [birth year], 61 y.o.   MRN: [id number]  DOS:  09/30/2021 Type of visit - description: cpx  For CPX Feels well except 3 months ago started to have a pain on the L hip. Pain is triggered by elevating the leg, located anteriorly, last a short time. No injury, walking and sitting causes no problems. He has stopped the stationary recumbent bike but that has not helped the problem.  Review of Systems  Other than above, a 14 point review of systems is negative     Past Medical History:  Diagnosis Date   Benign neoplasm of anus 2018   Bile duct stricture 2013   Gout 11/21/2017   Hypertension     Past Surgical History:  Procedure Laterality Date   BACK SURGERY     CHOLECYSTECTOMY  1993   ERCP  05/04/2011   Procedure: ENDOSCOPIC RETROGRADE CHOLANGIOPANCREATOGRAPHY (ERCP);  Surgeon: Jeryl Columbia, MD;  Location: [facility] ENDOSCOPY;  Service: Endoscopy;  Laterality: N/A;  fluoro   ERCP  06/28/2011   Procedure: ENDOSCOPIC RETROGRADE CHOLANGIOPANCREATOGRAPHY (ERCP);  Surgeon: Jeryl Columbia, MD;  Location: [facility] ENDOSCOPY;  Service: Endoscopy;  Laterality: N/A;  Dr. Watt Climes to do ERCP   ERCP  07/03/2011   Procedure: ENDOSCOPIC RETROGRADE CHOLANGIOPANCREATOGRAPHY (ERCP);  Surgeon: Wonda Horner, MD;  Location: [facility] ENDOSCOPY;  Service: Endoscopy;  Laterality: N/A;   ERCP  09/28/2011   Procedure: ENDOSCOPIC RETROGRADE CHOLANGIOPANCREATOGRAPHY (ERCP);  Surgeon: Jeryl Columbia, MD;  Location: [facility] ENDOSCOPY;  Service: Endoscopy;  Laterality: N/A;  Brushing/ stent change/spyglass case--tradd from [place] will be here   ERCP  11/22/2011   Procedure: ENDOSCOPIC RETROGRADE CHOLANGIOPANCREATOGRAPHY (ERCP);  Surgeon: Jeryl Columbia, MD;  Location: [facility] ENDOSCOPY;  Service: Endoscopy;  Laterality: N/A;   EUS  06/28/2011   Procedure: LOWER ENDOSCOPIC ULTRASOUND (EUS);  Surgeon: Jeryl Columbia, MD;  Location: [facility] ENDOSCOPY;  Service: Endoscopy;  Laterality: N/A;  Nicole/Katy     EUS  09/28/2011   Procedure: ESOPHAGEAL ENDOSCOPIC ULTRASOUND (EUS) RADIAL;  Surgeon: Jeryl Columbia, MD;  Location: [facility] ENDOSCOPY;  Service: Endoscopy;  Laterality: N/A;   FINE NEEDLE ASPIRATION  09/28/2011   Procedure: FINE NEEDLE ASPIRATION (FNA) LINEAR;  Surgeon: Jeryl Columbia, MD;  Location: [facility] ENDOSCOPY;  Service: Endoscopy;;   SPYGLASS CHOLANGIOSCOPY  09/28/2011   Procedure: [id number] CHOLANGIOSCOPY;  Surgeon: Jeryl Columbia, MD;  Location: [facility] ENDOSCOPY;  Service: Endoscopy;  Laterality: N/A;   Social History   Socioeconomic History   Marital status: Married    Spouse name: Not on file   Number of children: 2   Years of education: Not on file   Highest education level: Not on file  Occupational History   Occupation: Full time - Sales promotion account executive  Tobacco Use   Smoking status: Never   Smokeless tobacco: Never  Substance and Sexual Activity   Alcohol use: Yes    Alcohol/week: 2.0 standard drinks of alcohol    Types: 2 Cans of beer per week    Comment: weekends   Drug use: No   Sexual activity: Not on file  Other Topics Concern   Not on file  Social History Narrative   Son '93 airplane pilot  , daughter '97 ( psychology )   Social Determinants of Radio broadcast assistant Strain: Not on file  Food Insecurity: Not on file  Transportation Needs: Not on file  Physical Activity: Not on file  Stress:  Not on file  Social Connections: Not on file  Intimate Partner Violence: Not on file     Current Outpatient Medications  Medication Instructions   lisinopril (ZESTRIL) 20 mg, Oral, Daily   MITIGARE 0.6 MG CAPS 1 capsule, Oral, 2 times daily PRN   valACYclovir (VALTREX) 1000 MG tablet Take a total of 2 tablets for each  episode of cold sores       Objective:   Physical Exam BP 126/70   Pulse 65   Temp 98.1 F (36.7 C) (Oral)   Resp 16   Ht '6\' 3"'$  (1.905 m)   Wt 251 lb (113.9 kg)   SpO2 97%   BMI 31.37 kg/m  General: Well developed, NAD, BMI noted Neck: No   thyromegaly  HEENT:  Normocephalic . Face symmetric, atraumatic Lungs:  CTA B Normal respiratory effort, no intercostal retractions, no accessory muscle use. Heart: RRR,  no murmur.  Abdomen:  Not distended, soft, non-tender. No rebound or rigidity.   Lower extremities: no pretibial edema bilaterally DRE: Normal sphincter, stools normal, prostate normal Skin: Exposed areas without rash. Not pale. Not jaundice Neurologic:  alert & oriented X3.  Speech normal, gait appropriate for age and unassisted Strength symmetric and appropriate for age.  Psych: Cognition and judgment appear intact.  Cooperative with normal attention span and concentration.  Behavior appropriate. No anxious or depressed appearing.     Assessment     ASSESSMENT HTN Gout see OV note from 02/2017 Cholecystectomy, 1993 Bile duct stricture 2013  PLAN: Here for CPX HTN: BP is great, continue Lisinopril, checking labs. Gout: Checking uric acid. L hip pain: PT versus Ortho referral versus Motrin.  We agreed to take Motrin with GI precautions, he will call if not better for further advice.  Encouraged to go back to the recumbent bike. RTC 1 year

## 2021-09-30 NOTE — Assessment & Plan Note (Signed)
-   Td 2016 - shingrix,  flu shots, covid vaccine recommended  - CCS: cscope  at age 61, cscope 04-2015,  Colonoscopy 03/11/2021, tubular adenomas. Dr. Watt Climes, next per GI  --Prostate ca screening: + FH pt's  father. No symptoms, DRE negative, check PSA --Labs: CBC CMP FLP PSA TSH uric --Diet and exercise discussed.   - POA info provided

## 2021-09-30 NOTE — Patient Instructions (Signed)
Check the  blood pressure regularly BP GOAL is between 110/65 and  135/85. If it is consistently higher or lower, let me know     GO TO THE LAB : Get the blood work     GO TO THE FRONT DESK, PLEASE SCHEDULE YOUR APPOINTMENTS Come back for a physical exam in 1 year    "Living will", "Health Care Power of attorney": Advanced care planning  (If you already have a living will or healthcare power of attorney, please bring the copy to be scanned in your chart.)  Advance care planning is a process that supports adults in  understanding and sharing their preferences regarding future medical care.   The patient's preferences are recorded in documents called Advance Directives.    Advanced directives are completed (and can be modified at any time) while the patient is in full mental capacity.   The documentation should be available at all times to the patient, the family and the healthcare providers.  Bring in a copy to be scanned in your chart is an excellent idea and is recommended   This legal documents direct treatment decision making and/or appoint a surrogate to make the decision if the patient is not capable to do so.    Advance directives can be documented in many types of formats,  documents have names such as:  Lliving will  Durable power of attorney for healthcare (healthcare proxy or healthcare power of attorney)  Combined directives  Physician orders for life-sustaining treatment    More information at:  [email or web address] 

## 2022-03-11 DIAGNOSIS — H40013 Open angle with borderline findings, low risk, bilateral: Secondary | ICD-10-CM | POA: Diagnosis not present

## 2022-03-16 DIAGNOSIS — L570 Actinic keratosis: Secondary | ICD-10-CM | POA: Diagnosis not present

## 2022-03-16 DIAGNOSIS — D485 Neoplasm of uncertain behavior of skin: Secondary | ICD-10-CM | POA: Diagnosis not present

## 2022-03-16 DIAGNOSIS — L821 Other seborrheic keratosis: Secondary | ICD-10-CM | POA: Diagnosis not present

## 2022-03-16 DIAGNOSIS — L814 Other melanin hyperpigmentation: Secondary | ICD-10-CM | POA: Diagnosis not present

## 2022-03-16 DIAGNOSIS — D225 Melanocytic nevi of trunk: Secondary | ICD-10-CM | POA: Diagnosis not present

## 2022-04-07 DIAGNOSIS — M898X8 Other specified disorders of bone, other site: Secondary | ICD-10-CM | POA: Diagnosis not present

## 2022-04-07 DIAGNOSIS — M1611 Unilateral primary osteoarthritis, right hip: Secondary | ICD-10-CM | POA: Diagnosis not present

## 2022-04-07 DIAGNOSIS — R600 Localized edema: Secondary | ICD-10-CM | POA: Diagnosis not present

## 2022-04-11 DIAGNOSIS — L905 Scar conditions and fibrosis of skin: Secondary | ICD-10-CM | POA: Diagnosis not present

## 2022-04-11 DIAGNOSIS — D225 Melanocytic nevi of trunk: Secondary | ICD-10-CM | POA: Diagnosis not present

## 2022-06-21 ENCOUNTER — Encounter: Payer: Self-pay | Admitting: Internal Medicine

## 2022-06-21 MED ORDER — VALACYCLOVIR HCL 1 G PO TABS: ORAL_TABLET | ORAL | 3 refills | Status: DC

## 2022-08-08 DIAGNOSIS — Z6832 Body mass index (BMI) 32.0-32.9, adult: Secondary | ICD-10-CM | POA: Diagnosis not present

## 2022-08-08 DIAGNOSIS — M5416 Radiculopathy, lumbar region: Secondary | ICD-10-CM | POA: Diagnosis not present

## 2022-08-31 DIAGNOSIS — M47816 Spondylosis without myelopathy or radiculopathy, lumbar region: Secondary | ICD-10-CM | POA: Diagnosis not present

## 2022-08-31 DIAGNOSIS — Z6831 Body mass index (BMI) 31.0-31.9, adult: Secondary | ICD-10-CM | POA: Diagnosis not present

## 2022-08-31 DIAGNOSIS — M48061 Spinal stenosis, lumbar region without neurogenic claudication: Secondary | ICD-10-CM | POA: Diagnosis not present

## 2022-08-31 DIAGNOSIS — M5416 Radiculopathy, lumbar region: Secondary | ICD-10-CM | POA: Diagnosis not present

## 2022-09-01 ENCOUNTER — Other Ambulatory Visit: Payer: Self-pay

## 2022-09-01 MED ORDER — COLCHICINE 0.6 MG PO TABS: 0.6000 mg | ORAL_TABLET | Freq: Two times a day (BID) | ORAL | 2 refills | Status: AC | PRN

## 2022-09-29 DIAGNOSIS — M5416 Radiculopathy, lumbar region: Secondary | ICD-10-CM | POA: Diagnosis not present

## 2022-09-29 DIAGNOSIS — M5117 Intervertebral disc disorders with radiculopathy, lumbosacral region: Secondary | ICD-10-CM | POA: Diagnosis not present

## 2022-10-12 ENCOUNTER — Encounter: Payer: Self-pay | Admitting: Internal Medicine

## 2022-10-12 ENCOUNTER — Ambulatory Visit: Payer: BC Managed Care – PPO | Admitting: Internal Medicine

## 2022-10-12 VITALS — BP 126/76 | HR 68 | Temp 98.2°F | Resp 20 | Ht 75.0 in | Wt 247.0 lb

## 2022-10-12 DIAGNOSIS — Z0000 Encounter for general adult medical examination without abnormal findings: Secondary | ICD-10-CM

## 2022-10-12 DIAGNOSIS — Z8042 Family history of malignant neoplasm of prostate: Secondary | ICD-10-CM

## 2022-10-12 DIAGNOSIS — I10 Essential (primary) hypertension: Secondary | ICD-10-CM

## 2022-10-12 LAB — COMPREHENSIVE METABOLIC PANEL
ALT: 32 U/L (ref 0–53)
AST: 21 U/L (ref 0–37)
Albumin: 4.4 g/dL (ref 3.5–5.2)
Alkaline Phosphatase: 96 U/L (ref 39–117)
BUN: 18 mg/dL (ref 6–23)
CO2: 28 mEq/L (ref 19–32)
Calcium: 9.8 mg/dL (ref 8.4–10.5)
Chloride: 101 mEq/L (ref 96–112)
Creatinine, Ser: 0.98 mg/dL (ref 0.40–1.50)
GFR: 82.11 mL/min (ref >60.00)
Glucose, Bld: 87 mg/dL (ref 70–99)
Potassium: 4.4 mEq/L (ref 3.5–5.1)
Sodium: 138 mEq/L (ref 135–145)
Total Bilirubin: 0.8 mg/dL (ref 0.2–1.2)
Total Protein: 7.5 g/dL (ref 6.0–8.3)

## 2022-10-12 LAB — PSA: PSA: 1.91 ng/mL (ref 0.10–4.00)

## 2022-10-12 LAB — CBC WITH DIFFERENTIAL/PLATELET
Basophils Absolute: 0.1 10*3/uL (ref 0.0–0.1)
Basophils Relative: 0.4 % (ref 0.0–3.0)
Eosinophils Absolute: 0.1 10*3/uL (ref 0.0–0.7)
Eosinophils Relative: 0.5 % (ref 0.0–5.0)
HCT: 47.3 % (ref 39.0–52.0)
Hemoglobin: 15.8 g/dL (ref 13.0–17.0)
Lymphocytes Relative: 16.5 % (ref 12.0–46.0)
Lymphs Abs: 2.3 10*3/uL (ref 0.7–4.0)
MCHC: 33.4 g/dL (ref 30.0–36.0)
MCV: 92.2 fl (ref 78.0–100.0)
Monocytes Absolute: 0.8 10*3/uL (ref 0.1–1.0)
Monocytes Relative: 6 % (ref 3.0–12.0)
Neutro Abs: 10.6 10*3/uL — ABNORMAL HIGH (ref 1.4–7.7)
Neutrophils Relative %: 76.6 % (ref 43.0–77.0)
Platelets: 260 10*3/uL (ref 150.0–400.0)
RBC: 5.13 Mil/uL (ref 4.22–5.81)
RDW: 12.7 % (ref 11.5–15.5)
WBC: 13.9 10*3/uL — ABNORMAL HIGH (ref 4.0–10.5)

## 2022-10-12 LAB — LIPID PANEL
Cholesterol: 178 mg/dL (ref 0–200)
HDL: 54.7 mg/dL (ref >39.00)
LDL Cholesterol: 107 mg/dL — ABNORMAL HIGH (ref 0–99)
NonHDL: 122.9
Total CHOL/HDL Ratio: 3
Triglycerides: 78 mg/dL (ref 0.0–149.0)
VLDL: 15.6 mg/dL (ref 0.0–40.0)

## 2022-10-12 NOTE — Assessment & Plan Note (Signed)
Here for CPX HTN: On lisinopril.  BP has been elevated a couple times lately but is perfect today.  We agreed on continue lisinopril and monitor BPs.  See AVS.  Labs. L ear fullness: Exam is benign, etiology unclear.  Patient is somewhat bothered by it.  Recommend Flonase for 1 month (ET dysfunction?)  If not better he can contact me, ENT referral?. Cardiovascular risk: Currently calculated at 11.5%, the patient is aware that he qualifies for statins.  We have extensive discussion about the role of diet, exercise and the pros and cons of statins.  Will check a FLP, further advised with results. Recommend to calculate his own risk, see AVS. RTC 1 year

## 2022-10-12 NOTE — Progress Notes (Signed)
Subjective:    Patient ID: Jon Phillips, male    DOB: 15-May-1960, 62 y.o.   MRN: [id number]  DOS:  10/12/2022 Type of visit - description: CPX  Here for CPX. Had a MRI and a local back injection and at the time BP was slightly elevated. Having ill-defined left ear discomfort, fullness, denies any discharge.  No difficulty hearing.  Occasionally has tinnitus, bilaterally.   Review of Systems  Other than above, a 14 point review of systems is negative     Past Medical History:  Diagnosis Date   Benign neoplasm of anus 2018   Bile duct stricture 2013   Gout 11/21/2017   Hypertension     Past Surgical History:  Procedure Laterality Date   BACK SURGERY     CHOLECYSTECTOMY  1993   ERCP  05/04/2011   Procedure: ENDOSCOPIC RETROGRADE CHOLANGIOPANCREATOGRAPHY (ERCP);  Surgeon: Petra Kuba, MD;  Location: [facility] ENDOSCOPY;  Service: Endoscopy;  Laterality: N/A;  fluoro   ERCP  06/28/2011   Procedure: ENDOSCOPIC RETROGRADE CHOLANGIOPANCREATOGRAPHY (ERCP);  Surgeon: Petra Kuba, MD;  Location: [facility] ENDOSCOPY;  Service: Endoscopy;  Laterality: N/A;  Dr. Ewing Schlein to do ERCP   ERCP  07/03/2011   Procedure: ENDOSCOPIC RETROGRADE CHOLANGIOPANCREATOGRAPHY (ERCP);  Surgeon: Graylin Shiver, MD;  Location: [facility] ENDOSCOPY;  Service: Endoscopy;  Laterality: N/A;   ERCP  09/28/2011   Procedure: ENDOSCOPIC RETROGRADE CHOLANGIOPANCREATOGRAPHY (ERCP);  Surgeon: Petra Kuba, MD;  Location: [facility] ENDOSCOPY;  Service: Endoscopy;  Laterality: N/A;  Brushing/ stent change/spyglass case--tradd from [place] will be here   ERCP  11/22/2011   Procedure: ENDOSCOPIC RETROGRADE CHOLANGIOPANCREATOGRAPHY (ERCP);  Surgeon: Petra Kuba, MD;  Location: [facility] ENDOSCOPY;  Service: Endoscopy;  Laterality: N/A;   EUS  06/28/2011   Procedure: LOWER ENDOSCOPIC ULTRASOUND (EUS);  Surgeon: Petra Kuba, MD;  Location: [facility] ENDOSCOPY;  Service: Endoscopy;  Laterality: N/A;  Nicole/Katy    EUS  09/28/2011   Procedure: ESOPHAGEAL ENDOSCOPIC  ULTRASOUND (EUS) RADIAL;  Surgeon: Petra Kuba, MD;  Location: [facility] ENDOSCOPY;  Service: Endoscopy;  Laterality: N/A;   FINE NEEDLE ASPIRATION  09/28/2011   Procedure: FINE NEEDLE ASPIRATION (FNA) LINEAR;  Surgeon: Petra Kuba, MD;  Location: [facility] ENDOSCOPY;  Service: Endoscopy;;   SPYGLASS CHOLANGIOSCOPY  09/28/2011   Procedure: NWGNFAOZ CHOLANGIOSCOPY;  Surgeon: Petra Kuba, MD;  Location: [facility] ENDOSCOPY;  Service: Endoscopy;  Laterality: N/A;    Social History   Socioeconomic History   Marital status: Married    Spouse name: Not on file   Number of children: 2   Years of education: Not on file   Highest education level: Not on file  Occupational History   Occupation: Full time - Nature conservation officer  Tobacco Use   Smoking status: Never   Smokeless tobacco: Never  Substance and Sexual Activity   Alcohol use: Yes    Alcohol/week: 2.0 standard drinks of alcohol    Types: 2 Cans of beer per week    Comment: weekends   Drug use: No   Sexual activity: Not on file  Other Topics Concern   Not on file  Social History Narrative   Son '93 airplane pilot  , daughter '97 ( psychology )   Social Determinants of Corporate investment banker Strain: Not on file  Food Insecurity: Not on file  Transportation Needs: Not on file  Physical Activity: Not on file  Stress: Not on file  Social Connections: Not on file  Intimate Partner Violence:  Not on file      Current Outpatient Medications  Medication Instructions   colchicine 0.6 mg, Oral, 2 times daily PRN   lisinopril (ZESTRIL) 20 mg, Oral, Daily   valACYclovir (VALTREX) 1000 MG tablet Take a total of 2 tablets for each  episode of cold sores       Objective:   Physical Exam BP 126/76 (BP Location: Left Arm, Patient Position: Sitting, Cuff Size: Normal)   Pulse 68   Temp 98.2 F (36.8 C) (Oral)   Resp 20   Ht 6\' 3"  (1.905 m)   Wt 247 lb (112 kg)   SpO2 95%   BMI 30.87 kg/m  General: Well developed, NAD, BMI noted Neck: No   thyromegaly  HEENT:  Normocephalic . Face symmetric, atraumatic. Ears: Normal bilaterally Lungs:  CTA B Normal respiratory effort, no intercostal retractions, no accessory muscle use. Heart: RRR,  no murmur.  Abdomen:  Not distended, soft, non-tender. No rebound or rigidity.   Lower extremities: no pretibial edema bilaterally  Skin: Exposed areas without rash. Not pale. Not jaundice Neurologic:  alert & oriented X3.  Speech normal, gait appropriate for age and unassisted Strength symmetric and appropriate for age.  Psych: Cognition and judgment appear intact.  Cooperative with normal attention span and concentration.  Behavior appropriate. No anxious or depressed appearing.     Assessment     ASSESSMENT HTN Gout see OV note from 02/2017 Cholecystectomy, 1993 Bile duct stricture 2013  PLAN: Here for CPX Td 2016 Has declined any other vaccines and has no changes mind. - CCS: cscope  at age 78, cscope 04-2015,  Colonoscopy 03/11/2021, sessile serrated  adenomas. Dr. Ewing Schlein, next per GI; rec  pt to call GI, suspect f/u should be 3 years  ,  --Prostate ca screening: + FH pt's  father. No symptoms,  check PSA --Labs: CMP FLP CBC PSA --Diet and exercise discussed.   - Healthcare POA discussed. HTN: On lisinopril.  BP has been elevated a couple times lately but is perfect today.  We agreed on continue lisinopril and monitor BPs.  See AVS.  Labs. L ear fullness: Exam is benign, etiology unclear.  Patient is somewhat bothered by it.  Recommend Flonase for 1 month (ET dysfunction?)  If not better he can contact me, ENT referral?. Cardiovascular risk: Currently calculated at 11.5%, the patient is aware that he qualifies for statins.  We have extensive discussion about the role of diet, exercise and the pros and cons of statins.  Will check a FLP, further advised with results. Recommend to calculate his own risk, see AVS. RTC 1 year

## 2022-10-12 NOTE — Patient Instructions (Addendum)
Check the  blood pressure regularly BP GOAL is between 110/65 and  135/85. If it is consistently higher or lower, let me know   [email or web address]?   GO TO THE LAB : Get the blood work     GO TO THE FRONT DESK, PLEASE SCHEDULE YOUR APPOINTMENTS Come back for  a physical in 1 year      Please bring or send [place] a copy of your Healthcare Power of Attorney for your chart.

## 2022-10-12 NOTE — Assessment & Plan Note (Signed)
Td 2016 Has declined any other vaccines and has no changes mind. - CCS: cscope  at age 62, cscope 04-2015,  Colonoscopy 03/11/2021, sessile serrated  adenomas. Dr. Ewing Schlein, next per GI; rec  pt to call GI, suspect f/u should be 3 years  ,  --Prostate ca screening: + FH pt's  father. No symptoms,  check PSA --Labs: CMP FLP CBC PSA --Diet and exercise discussed.   - Healthcare POA discussed.

## 2022-10-14 ENCOUNTER — Other Ambulatory Visit: Payer: Self-pay

## 2022-10-14 DIAGNOSIS — D72829 Elevated white blood cell count, unspecified: Secondary | ICD-10-CM

## 2022-10-14 DIAGNOSIS — E785 Hyperlipidemia, unspecified: Secondary | ICD-10-CM

## 2022-10-24 ENCOUNTER — Other Ambulatory Visit: Payer: Self-pay | Admitting: Internal Medicine

## 2023-02-15 ENCOUNTER — Other Ambulatory Visit (INDEPENDENT_AMBULATORY_CARE_PROVIDER_SITE_OTHER): Payer: BC Managed Care – PPO

## 2023-02-15 DIAGNOSIS — E785 Hyperlipidemia, unspecified: Secondary | ICD-10-CM | POA: Diagnosis not present

## 2023-02-15 DIAGNOSIS — D72829 Elevated white blood cell count, unspecified: Secondary | ICD-10-CM

## 2023-02-15 LAB — LIPID PANEL
Cholesterol: 158 mg/dL (ref 0–200)
HDL: 38.1 mg/dL — ABNORMAL LOW (ref >39.00)
LDL Cholesterol: 93 mg/dL (ref 0–99)
NonHDL: 119.77
Total CHOL/HDL Ratio: 4
Triglycerides: 132 mg/dL (ref 0.0–149.0)
VLDL: 26.4 mg/dL (ref 0.0–40.0)

## 2023-02-15 LAB — CBC
HCT: 46 % (ref 39.0–52.0)
Hemoglobin: 15.4 g/dL (ref 13.0–17.0)
MCHC: 33.5 g/dL (ref 30.0–36.0)
MCV: 93.4 fL (ref 78.0–100.0)
Platelets: 241 10*3/uL (ref 150.0–400.0)
RBC: 4.92 Mil/uL (ref 4.22–5.81)
RDW: 12.3 % (ref 11.5–15.5)
WBC: 7.2 10*3/uL (ref 4.0–10.5)

## 2023-03-16 DIAGNOSIS — H40013 Open angle with borderline findings, low risk, bilateral: Secondary | ICD-10-CM | POA: Diagnosis not present

## 2023-03-24 DIAGNOSIS — L821 Other seborrheic keratosis: Secondary | ICD-10-CM | POA: Diagnosis not present

## 2023-03-24 DIAGNOSIS — L570 Actinic keratosis: Secondary | ICD-10-CM | POA: Diagnosis not present

## 2023-03-24 DIAGNOSIS — D225 Melanocytic nevi of trunk: Secondary | ICD-10-CM | POA: Diagnosis not present

## 2023-03-24 DIAGNOSIS — L814 Other melanin hyperpigmentation: Secondary | ICD-10-CM | POA: Diagnosis not present

## 2023-04-28 ENCOUNTER — Encounter: Payer: Self-pay | Admitting: Internal Medicine

## 2023-04-28 ENCOUNTER — Other Ambulatory Visit: Payer: Self-pay | Admitting: Internal Medicine

## 2023-05-01 MED ORDER — LISINOPRIL 20 MG PO TABS: 20.0000 mg | ORAL_TABLET | Freq: Every day | ORAL | 1 refills | Status: DC

## 2023-06-28 DIAGNOSIS — L814 Other melanin hyperpigmentation: Secondary | ICD-10-CM | POA: Diagnosis not present

## 2023-06-28 DIAGNOSIS — Z09 Encounter for follow-up examination after completed treatment for conditions other than malignant neoplasm: Secondary | ICD-10-CM | POA: Diagnosis not present

## 2023-06-28 DIAGNOSIS — L578 Other skin changes due to chronic exposure to nonionizing radiation: Secondary | ICD-10-CM | POA: Diagnosis not present

## 2023-06-28 DIAGNOSIS — L570 Actinic keratosis: Secondary | ICD-10-CM | POA: Diagnosis not present

## 2023-08-29 ENCOUNTER — Other Ambulatory Visit: Payer: Self-pay | Admitting: Internal Medicine

## 2023-10-17 ENCOUNTER — Encounter: Payer: Self-pay | Admitting: Internal Medicine

## 2023-10-17 ENCOUNTER — Ambulatory Visit (INDEPENDENT_AMBULATORY_CARE_PROVIDER_SITE_OTHER): Payer: BC Managed Care – PPO | Admitting: Internal Medicine

## 2023-10-17 VITALS — BP 126/76 | HR 72 | Temp 97.8°F | Resp 16 | Ht 75.0 in | Wt 253.5 lb

## 2023-10-17 DIAGNOSIS — E785 Hyperlipidemia, unspecified: Secondary | ICD-10-CM | POA: Diagnosis not present

## 2023-10-17 DIAGNOSIS — R079 Chest pain, unspecified: Secondary | ICD-10-CM | POA: Diagnosis not present

## 2023-10-17 DIAGNOSIS — Z0001 Encounter for general adult medical examination with abnormal findings: Secondary | ICD-10-CM | POA: Diagnosis not present

## 2023-10-17 DIAGNOSIS — Z0000 Encounter for general adult medical examination without abnormal findings: Secondary | ICD-10-CM | POA: Diagnosis not present

## 2023-10-17 DIAGNOSIS — I10 Essential (primary) hypertension: Secondary | ICD-10-CM | POA: Diagnosis not present

## 2023-10-17 LAB — COMPREHENSIVE METABOLIC PANEL WITH GFR
ALT: 26 U/L (ref 0–53)
AST: 25 U/L (ref 0–37)
Albumin: 4.4 g/dL (ref 3.5–5.2)
Alkaline Phosphatase: 88 U/L (ref 39–117)
BUN: 12 mg/dL (ref 6–23)
CO2: 29 meq/L (ref 19–32)
Calcium: 9.5 mg/dL (ref 8.4–10.5)
Chloride: 103 meq/L (ref 96–112)
Creatinine, Ser: 0.95 mg/dL (ref 0.40–1.50)
GFR: 84.63 mL/min (ref >60.00)
Glucose, Bld: 116 mg/dL — ABNORMAL HIGH (ref 70–99)
Potassium: 4.4 meq/L (ref 3.5–5.1)
Sodium: 138 meq/L (ref 135–145)
Total Bilirubin: 0.8 mg/dL (ref 0.2–1.2)
Total Protein: 7.4 g/dL (ref 6.0–8.3)

## 2023-10-17 LAB — CBC WITH DIFFERENTIAL/PLATELET
Basophils Absolute: 0 K/uL (ref 0.0–0.1)
Basophils Relative: 0.7 % (ref 0.0–3.0)
Eosinophils Absolute: 0.1 K/uL (ref 0.0–0.7)
Eosinophils Relative: 1.6 % (ref 0.0–5.0)
HCT: 44.2 % (ref 39.0–52.0)
Hemoglobin: 15 g/dL (ref 13.0–17.0)
Lymphocytes Relative: 24.2 % (ref 12.0–46.0)
Lymphs Abs: 1.8 K/uL (ref 0.7–4.0)
MCHC: 34 g/dL (ref 30.0–36.0)
MCV: 90 fl (ref 78.0–100.0)
Monocytes Absolute: 0.6 K/uL (ref 0.1–1.0)
Monocytes Relative: 7.9 % (ref 3.0–12.0)
Neutro Abs: 4.9 K/uL (ref 1.4–7.7)
Neutrophils Relative %: 65.6 % (ref 43.0–77.0)
Platelets: 229 K/uL (ref 150.0–400.0)
RBC: 4.91 Mil/uL (ref 4.22–5.81)
RDW: 12.8 % (ref 11.5–15.5)
WBC: 7.5 K/uL (ref 4.0–10.5)

## 2023-10-17 LAB — LIPID PANEL
Cholesterol: 159 mg/dL (ref 0–200)
HDL: 44.7 mg/dL (ref >39.00)
LDL Cholesterol: 89 mg/dL (ref 0–99)
NonHDL: 114.36
Total CHOL/HDL Ratio: 4
Triglycerides: 125 mg/dL (ref 0.0–149.0)
VLDL: 25 mg/dL (ref 0.0–40.0)

## 2023-10-17 LAB — PSA: PSA: 5.65 ng/mL — ABNORMAL HIGH (ref 0.10–4.00)

## 2023-10-17 NOTE — Assessment & Plan Note (Signed)
 Here for CPX  Other issues addressed today: Chest pain: As described above, atypical. Current 10-year cardiovascular risk 13.2%.   No FH, non-smoker.  + HTN.  + Mild high cholesterol, declined statins. EKG: Sinus rhythm.  No acute changes.  No change compared to previous EKG Plan: Stress test, call if symptoms change. HTN: BP today looks good.  No recent ambulatory BPs, checking labs, continue lisinopril . RTC 4 months

## 2023-10-17 NOTE — Patient Instructions (Addendum)
 Vaccines are recommended: Flu shot COVID booster   GO TO THE LAB :  Get the blood work   Your results will be posted on MyChart with my comments  Next office visit for a check up in 4 months  Please make an appointment before you leave today

## 2023-10-17 NOTE — Assessment & Plan Note (Signed)
 Here for CPX -Td 2016 -Has declined vaccines before.  I recommend a flu shot and COVID booster. - CCS: cscope  at age 63, cscope 04-2015,  Colonoscopy 03/11/2021, sessile serrated  adenomas. Dr. Rosalie. Next cscope : Per pathology report 5 years. --Prostate ca screening: + FH pt's  father. No symptoms,  check PSA --Labs: CMP FLP CBC PSA --Diet and exercise discussed.

## 2023-10-17 NOTE — Progress Notes (Signed)
 Subjective:    Patient ID: Jon Phillips, male    DOB: [birth year], 63 y.o.   MRN: [id number]  DOS:  10/17/2023 Type of visit - description: CPX  Here for CPX. For about a year he is having episodes of chest tightness, located on the right side mostly. No radiation. Happen at rest or when he is doing yard work. No associated nausea or vomiting.  No diaphoresis or shortness of breath.  No palpitation. No heartburn per se. Other than above he feels well.  Review of Systems  Other than above, a 14 point review of systems is negative     Past Medical History:  Diagnosis Date   Benign neoplasm of anus 2018   Bile duct stricture 2013   Gout 11/21/2017   Hypertension     Past Surgical History:  Procedure Laterality Date   BACK SURGERY     CHOLECYSTECTOMY  1993   ERCP  05/04/2011   Procedure: ENDOSCOPIC RETROGRADE CHOLANGIOPANCREATOGRAPHY (ERCP);  Surgeon: Oliva FORBES Boots, MD;  Location: [facility] ENDOSCOPY;  Service: Endoscopy;  Laterality: N/A;  fluoro   ERCP  06/28/2011   Procedure: ENDOSCOPIC RETROGRADE CHOLANGIOPANCREATOGRAPHY (ERCP);  Surgeon: Oliva FORBES Boots, MD;  Location: [facility];  Service: Endoscopy;  Laterality: N/A;  Dr. Boots to do ERCP   ERCP  07/03/2011   Procedure: ENDOSCOPIC RETROGRADE CHOLANGIOPANCREATOGRAPHY (ERCP);  Surgeon: Lesta JULIANNA Fitz, MD;  Location: [facility];  Service: Endoscopy;  Laterality: N/A;   ERCP  09/28/2011   Procedure: ENDOSCOPIC RETROGRADE CHOLANGIOPANCREATOGRAPHY (ERCP);  Surgeon: Oliva FORBES Boots, MD;  Location: [facility];  Service: Endoscopy;  Laterality: N/A;  Brushing/ stent change/spyglass case--tradd from [place] will be here   ERCP  11/22/2011   Procedure: ENDOSCOPIC RETROGRADE CHOLANGIOPANCREATOGRAPHY (ERCP);  Surgeon: Oliva FORBES Boots, MD;  Location: [facility];  Service: Endoscopy;  Laterality: N/A;   EUS  06/28/2011   Procedure: LOWER ENDOSCOPIC ULTRASOUND (EUS);  Surgeon: Oliva FORBES Boots, MD;  Location: [facility];  Service: Endoscopy;   Laterality: N/A;  Nicole/Katy    EUS  09/28/2011   Procedure: ESOPHAGEAL ENDOSCOPIC ULTRASOUND (EUS) RADIAL;  Surgeon: Oliva FORBES Boots, MD;  Location: [facility];  Service: Endoscopy;  Laterality: N/A;   FINE NEEDLE ASPIRATION  09/28/2011   Procedure: FINE NEEDLE ASPIRATION (FNA) LINEAR;  Surgeon: Oliva FORBES Boots, MD;  Location: [facility] ENDOSCOPY;  Service: Endoscopy;;   SPYGLASS CHOLANGIOSCOPY  09/28/2011   Procedure: DEBHOJDD CHOLANGIOSCOPY;  Surgeon: Oliva FORBES Boots, MD;  Location: [facility] END[facility]COPY;  Service: Endoscopy;  Laterality: N/A;   Social History   Socioeconomic History   Marital status: Married    Spouse name: Not on file   Number of children: 2   Years of education: Not on file   Highest education level: Not on file  Occupational History   Occupation: Full time - Nature conservation officer  Tobacco Use   Smoking status: Never   Smokeless tobacco: Never  Substance and Sexual Activity   Alcohol use: Yes    Alcohol/week: 2.0 standard drinks of alcohol    Types: 2 Cans of beer per week    Comment: weekends   Drug use: No   Sexual activity: Not on file  Other Topics Concern   Not on file  Social History Narrative   Son '93 airplane pilot  , daughter '97 ( psychology )   Social Drivers of Corporate investment banker Strain: Not on file  Food Insecurity: Not on file  Transportation Needs: Not on file  Physical Activity:  Not on file  Stress: Not on file  Social Connections: Unknown (03/24/2022)   Received from [facility]   Social Network    Social Network: Not on file  Intimate Partner Violence: Unknown (03/24/2022)   Received from [facility]   HITS    Physically Hurt: Not on file    Insult or Talk Down To: Not on file    Threaten Physical Harm: Not on file    Scream or Curse: Not on file    Current Outpatient Medications  Medication Instructions   colchicine  0.6 mg, Oral, 2 times daily PRN   lisinopril  (ZESTRIL ) 20 mg, Oral, Daily   valACYclovir  (VALTREX ) 1000 MG tablet TAKE  A TOTAL OF 2 TABLETS FOR EACH EPISODE OF COLD SORES       Objective:   Physical Exam BP 126/76   Pulse 72   Temp 97.8 F (36.6 C) (Oral)   Resp 16   Ht 6' 3 (1.905 m)   Wt 253 lb 8 oz (115 kg)   SpO2 97%   BMI 31.69 kg/m  General: Well developed, NAD, BMI noted Neck: No  thyromegaly  HEENT:  Normocephalic . Face symmetric, atraumatic Lungs:  CTA B Normal respiratory effort, no intercostal retractions, no accessory muscle use. Heart: RRR,  no murmur.  Abdomen:  Not distended, soft, non-tender. No rebound or rigidity.   Lower extremities: no pretibial edema bilaterally  Skin: Exposed areas without rash. Not pale. Not jaundice Neurologic:  alert & oriented X3.  Speech normal, gait appropriate for age and unassisted Strength symmetric and appropriate for age.  Psych: Cognition and judgment appear intact.  Cooperative with normal attention span and concentration.  Behavior appropriate. No anxious or depressed appearing.     Assessment    ASSESSMENT HTN Gout see OV note from 02/2017 Cholecystectomy, 1993 Bile duct stricture 2013  PLAN: Here for CPX -Td 2016 -Has declined vaccines before.  I recommend a flu shot and COVID booster. - CCS: cscope  at age 47, cscope 04-2015,  Colonoscopy 03/11/2021, sessile serrated  adenomas. Dr. Rosalie. Next cscope : Per pathology report 5 years. --Prostate ca screening: + FH pt's  father. No symptoms,  check PSA --Labs: CMP FLP CBC PSA --Diet and exercise discussed.    Other issues addressed today: Chest pain: As described above, atypical. Current 10-year cardiovascular risk 13.2%.   No FH, non-smoker.  + HTN.  + Mild high cholesterol, declined statins. EKG: Sinus rhythm.  No acute changes.  No change compared to previous EKG Plan: Stress test, call if symptoms change. HTN: BP today looks good.  No recent ambulatory BPs, checking labs, continue lisinopril . RTC 4 months

## 2023-10-18 ENCOUNTER — Ambulatory Visit: Payer: Self-pay | Admitting: Internal Medicine

## 2023-10-18 DIAGNOSIS — R972 Elevated prostate specific antigen [PSA]: Secondary | ICD-10-CM

## 2023-10-27 ENCOUNTER — Encounter: Payer: Self-pay | Admitting: Internal Medicine

## 2023-10-30 ENCOUNTER — Encounter (HOSPITAL_COMMUNITY): Payer: Self-pay | Admitting: Internal Medicine

## 2023-10-31 ENCOUNTER — Telehealth (HOSPITAL_COMMUNITY): Payer: Self-pay

## 2023-10-31 NOTE — Telephone Encounter (Signed)
 Detailed instructions left on the patient's answering machine. Jon Phillips CCT

## 2023-11-01 NOTE — Telephone Encounter (Signed)
 Pt requesting a c/b or post information on MyChart.

## 2023-11-03 ENCOUNTER — Other Ambulatory Visit: Payer: Self-pay | Admitting: Internal Medicine

## 2023-11-03 DIAGNOSIS — R079 Chest pain, unspecified: Secondary | ICD-10-CM

## 2023-11-05 ENCOUNTER — Other Ambulatory Visit: Payer: Self-pay | Admitting: Internal Medicine

## 2023-11-07 ENCOUNTER — Ambulatory Visit (HOSPITAL_COMMUNITY)
Admission: RE | Admit: 2023-11-07 | Source: Ambulatory Visit | Attending: Internal Medicine | Admitting: Internal Medicine

## 2023-12-19 ENCOUNTER — Other Ambulatory Visit (INDEPENDENT_AMBULATORY_CARE_PROVIDER_SITE_OTHER)

## 2023-12-19 DIAGNOSIS — R972 Elevated prostate specific antigen [PSA]: Secondary | ICD-10-CM

## 2023-12-19 LAB — URINALYSIS, ROUTINE W REFLEX MICROSCOPIC
Bilirubin Urine: NEGATIVE
Hgb urine dipstick: NEGATIVE
Ketones, ur: NEGATIVE
Leukocytes,Ua: NEGATIVE
Nitrite: NEGATIVE
RBC / HPF: NONE SEEN (ref 0–?)
Specific Gravity, Urine: 1.01 (ref 1.000–1.030)
Total Protein, Urine: NEGATIVE
Urine Glucose: NEGATIVE
Urobilinogen, UA: 0.2 (ref 0.0–1.0)
pH: 6 (ref 5.0–8.0)

## 2023-12-19 LAB — PSA: PSA: 3.38 ng/mL (ref 0.10–4.00)

## 2023-12-20 ENCOUNTER — Ambulatory Visit: Payer: Self-pay | Admitting: Family

## 2023-12-20 LAB — URINE CULTURE
MICRO NUMBER:: 16974161
Result:: NO GROWTH
SPECIMEN QUALITY:: ADEQUATE

## 2024-02-20 ENCOUNTER — Ambulatory Visit: Admitting: Internal Medicine

## 2024-03-22 ENCOUNTER — Other Ambulatory Visit: Payer: Self-pay | Admitting: Family

## 2024-04-03 DIAGNOSIS — L111 Transient acantholytic dermatosis [Grover]: Secondary | ICD-10-CM | POA: Diagnosis not present

## 2024-04-03 DIAGNOSIS — D1801 Hemangioma of skin and subcutaneous tissue: Secondary | ICD-10-CM | POA: Diagnosis not present

## 2024-04-03 DIAGNOSIS — L570 Actinic keratosis: Secondary | ICD-10-CM | POA: Diagnosis not present

## 2024-04-03 DIAGNOSIS — L814 Other melanin hyperpigmentation: Secondary | ICD-10-CM | POA: Diagnosis not present

## 2024-04-03 DIAGNOSIS — L821 Other seborrheic keratosis: Secondary | ICD-10-CM | POA: Diagnosis not present

## 2024-04-17 ENCOUNTER — Ambulatory Visit: Admitting: Internal Medicine

## 2024-10-18 ENCOUNTER — Encounter: Payer: Self-pay | Admitting: Internal Medicine
